# Patient Record
Sex: Male | Born: 1985 | Race: White | Hispanic: No | Marital: Married | Smoking: Former smoker
Health system: Southern US, Community
[De-identification: ages and names within clinical notes are randomized; demographics above are authoritative.]

## PROBLEM LIST (undated history)

## (undated) ENCOUNTER — Emergency Department (HOSPITAL_COMMUNITY): Discharge status: Against Medical Advice | Payer: Self-pay

## (undated) DIAGNOSIS — I10 Essential (primary) hypertension: Secondary | ICD-10-CM

## (undated) DIAGNOSIS — B019 Varicella without complication: Secondary | ICD-10-CM

## (undated) DIAGNOSIS — T7840XA Allergy, unspecified, initial encounter: Secondary | ICD-10-CM

## (undated) DIAGNOSIS — D229 Melanocytic nevi, unspecified: Secondary | ICD-10-CM

## (undated) DIAGNOSIS — L409 Psoriasis, unspecified: Secondary | ICD-10-CM

## (undated) HISTORY — PX: VASECTOMY: SHX75

## (undated) HISTORY — PX: WISDOM TOOTH EXTRACTION: SHX21

## (undated) HISTORY — PX: OTHER SURGICAL HISTORY: SHX169

## (undated) HISTORY — DX: Melanocytic nevi, unspecified: D22.9

## (undated) HISTORY — DX: Varicella without complication: B01.9

## (undated) HISTORY — DX: Psoriasis, unspecified: L40.9

## (undated) HISTORY — DX: Allergy, unspecified, initial encounter: T78.40XA

---
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
## 2015-05-28 DIAGNOSIS — D229 Melanocytic nevi, unspecified: Secondary | ICD-10-CM

## 2015-05-28 HISTORY — DX: Melanocytic nevi, unspecified: D22.9

## 2015-10-16 ENCOUNTER — Encounter: Payer: Self-pay | Admitting: Family Medicine

## 2015-10-16 ENCOUNTER — Ambulatory Visit (INDEPENDENT_AMBULATORY_CARE_PROVIDER_SITE_OTHER): Payer: 59 | Admitting: Family Medicine

## 2015-10-16 ENCOUNTER — Other Ambulatory Visit: Payer: Self-pay | Admitting: Family Medicine

## 2015-10-16 VITALS — BP 128/94 | HR 86 | Temp 98.0°F | Ht 72.08 in | Wt 217.8 lb

## 2015-10-16 DIAGNOSIS — Z0000 Encounter for general adult medical examination without abnormal findings: Secondary | ICD-10-CM | POA: Insufficient documentation

## 2015-10-16 DIAGNOSIS — Z13220 Encounter for screening for lipoid disorders: Secondary | ICD-10-CM

## 2015-10-16 DIAGNOSIS — L409 Psoriasis, unspecified: Secondary | ICD-10-CM | POA: Insufficient documentation

## 2015-10-16 DIAGNOSIS — R197 Diarrhea, unspecified: Secondary | ICD-10-CM | POA: Diagnosis not present

## 2015-10-16 DIAGNOSIS — R1013 Epigastric pain: Secondary | ICD-10-CM | POA: Diagnosis not present

## 2015-10-16 LAB — CBC
HCT: 45.3 % (ref 39.0–52.0)
Hemoglobin: 15.1 g/dL (ref 13.0–17.0)
MCHC: 33.4 g/dL (ref 30.0–36.0)
MCV: 87.8 fl (ref 78.0–100.0)
Platelets: 211 10*3/uL (ref 150.0–400.0)
RBC: 5.16 Mil/uL (ref 4.22–5.81)
RDW: 13.1 % (ref 11.5–15.5)
WBC: 9.4 10*3/uL (ref 4.0–10.5)

## 2015-10-16 LAB — COMPREHENSIVE METABOLIC PANEL
ALT: 36 U/L (ref 0–53)
AST: 27 U/L (ref 0–37)
Albumin: 5 g/dL (ref 3.5–5.2)
Alkaline Phosphatase: 79 U/L (ref 39–117)
BUN: 13 mg/dL (ref 6–23)
CALCIUM: 9.9 mg/dL (ref 8.4–10.5)
CHLORIDE: 102 meq/L (ref 96–112)
CO2: 29 meq/L (ref 19–32)
CREATININE: 0.99 mg/dL (ref 0.40–1.50)
GFR: 94.97 mL/min (ref >60.00)
GLUCOSE: 79 mg/dL (ref 70–99)
Potassium: 4.2 mEq/L (ref 3.5–5.1)
SODIUM: 138 meq/L (ref 135–145)
Total Bilirubin: 0.5 mg/dL (ref 0.2–1.2)
Total Protein: 7.5 g/dL (ref 6.0–8.3)

## 2015-10-16 LAB — LIPID PANEL
CHOL/HDL RATIO: 5
Cholesterol: 225 mg/dL — ABNORMAL HIGH (ref 0–200)
HDL: 45.2 mg/dL (ref >39.00)
LDL CALC: 161 mg/dL — AB (ref 0–99)
NonHDL: 179.79
TRIGLYCERIDES: 93 mg/dL (ref 0.0–149.0)
VLDL: 18.6 mg/dL (ref 0.0–40.0)

## 2015-10-16 MED ORDER — OMEPRAZOLE 40 MG PO CPDR: 40.0000 mg | DELAYED_RELEASE_CAPSULE | Freq: Every day | ORAL | Status: DC

## 2015-10-16 NOTE — Patient Instructions (Signed)
It was nice to see you today.  We will call with your lab results.  Keep a diary of your food intake to see if you can find a trigger. Try and avoid gluten.  We will arrange follow up after your labs.  Merry Christmas  Dr. Lacinda Axon

## 2015-10-16 NOTE — Assessment & Plan Note (Signed)
Stable on Stelara. Followed by [facility] in [place].

## 2015-10-16 NOTE — Assessment & Plan Note (Addendum)
New problem with other associated symptoms (gas, loose stool). Unclear etiology at this point. DDX: IBS, Celiac disease, Gastritis, GERD. Obtaining laboratory workup today: CBC, CMP, celiac panel. Additionally,  sending home with Hemoccult cards. Advised patient to keep a food diary to see if he can find dietary triggers. Starting patient on PPI for possible GERD/gastritis component. Patient to follow-up after laboratory studies return.

## 2015-10-16 NOTE — Progress Notes (Signed)
Pre visit review using our clinic review tool, if applicable. No additional management support is needed unless otherwise documented below in the visit note. 

## 2015-10-16 NOTE — Progress Notes (Signed)
Subjective:  Patient ID: Samuel Horton, male    DOB: 19-Sep-1986  Age: 29 y.o. MRN: [id number]  CC: Establish care; Abdominal pain/gas/loose stool  HPI Samuel Horton is a 29 y.o. male presents to the clinic today to establish care.  He also has an acute complaint (see below).  Preventative Healthcare  Immunizations  Tetanus - Up to date; Had it 3-4 years ago.  Flu - Had in November.  Labs: See orders.  Exercise: Regular exercise.  Alcohol use: See below.  Smoking/tobacco use: Former smoker.  Regular dental exams: Yes.   Wears seat belt: Yes.   Abdominal pain, Gas, Loose stools  Patient reports a one-month history of the above symptoms. He states that it started after a meal.  He states that he initially had diarrhea. He currently has associated loose stools but they have form.  Abdominal pain is located in the epigastric region.  Abdominal pain is mild to moderate in severity.  He reports that his stomach is "uneasy". He has severe gas.  Additionally, he states that he's had some dark foul smelling stool.  No interventions or medications tried.  No known relieving or exacerbating factors. He states he has not changed his diet. No recent antibiotic use. No recent medication changes.   No reports of fever or chills. No associated nausea or vomiting. No reports of heartburn/reflux.  PMH, Surgical Hx, Family Hx, Social History reviewed and updated as below.  Past Medical History  Diagnosis Date  . Chicken pox    Past Surgical History  Procedure Laterality Date  . No surgical history     Family History  Problem Relation Age of Onset  . Breast cancer Maternal Grandmother   . Hyperlipidemia Maternal Grandfather   . Mental illness Paternal Grandmother    Social History  Substance Use Topics  . Smoking status: Former Research scientist (life sciences)  . Smokeless tobacco: Never Used  . Alcohol Use: 3.6 oz/week    6 Standard drinks or equivalent per week   Review of Systems    Gastrointestinal: Positive for abdominal pain and diarrhea.  Skin: Positive for rash.  All other systems reviewed and are negative.  Objective:   Today's Vitals: BP 128/94 mmHg  Pulse 86  Temp(Src) 98 F (36.7 C) (Oral)  Ht 6' 0.08" (1.831 m)  Wt 217 lb 12 oz (98.771 kg)  BMI 29.46 kg/m2  SpO2 97%  Physical Exam  Constitutional: He is oriented to person, place, and time. He appears well-developed and well-nourished. No distress.  HENT:  Head: Normocephalic and atraumatic.  Mouth/Throat: Oropharynx is clear and moist. No oropharyngeal exudate.  Normal TM's bilaterally.   Eyes: Conjunctivae are normal. No scleral icterus.  Neck: Neck supple.  Cardiovascular: Normal rate and regular rhythm.   No murmur heard. Pulmonary/Chest: Effort normal and breath sounds normal. He has no wheezes. He has no rales.  Abdominal: Soft.  Mildly tender to palpation epigastric region. No rebound or guarding. +BS.  Musculoskeletal: Normal range of motion. He exhibits no edema.  Lymphadenopathy:    He has no cervical adenopathy.  Neurological: He is alert and oriented to person, place, and time.  Psychiatric: He has a normal mood and affect.  Vitals reviewed.  Assessment & Plan:   Problem List Items Addressed This Visit    Psoriasis    Stable on Stelara. Followed by [facility] in [place].      Preventative health care - Primary    Tetanus and influenza up-to-date. Screening labs today: Lipid  panel. Patient is regular exercise and has regular dental care. Preventative healthcare up-to-date excluding HIV. Patient is recently married and monogamous making him at low risk.      Epigastric pain    New problem with other associated symptoms (gas, loose stool). Unclear etiology at this point. DDX: IBS, Celiac disease, Gastritis, GERD. Obtaining laboratory workup today: CBC, CMP, celiac panel. Additionally,  sending home with Hemoccult cards. Advised patient to keep a food diary to  see if he can find dietary triggers. Starting patient on PPI for possible GERD/gastritis component. Patient to follow-up after laboratory studies return.       Relevant Orders   CBC   Comp Met (CMET)   Gliadin antibodies, serum   Tissue transglutaminase, IgA   Endomysial ab scrn + titer, IgA    Other Visit Diagnoses    Diarrhea, unspecified type        Relevant Orders    CBC    Gliadin antibodies, serum    Tissue transglutaminase, IgA    Endomysial ab scrn + titer, IgA    Screening, lipid        Relevant Orders    Lipid panel       Outpatient Encounter Prescriptions as of 10/16/2015  Medication Sig  . STELARA 45 MG/0.5ML SOSY injection   . omeprazole (PRILOSEC) 40 MG capsule Take 1 capsule (40 mg total) by mouth daily.   No facility-administered encounter medications on file as of 10/16/2015.    Follow-up: Follow up to be scheduled after lab results.   [place]

## 2015-10-16 NOTE — Assessment & Plan Note (Signed)
Tetanus and influenza up-to-date. Screening labs today: Lipid panel. Patient is regular exercise and has regular dental care. Preventative healthcare up-to-date excluding HIV. Patient is recently married and monogamous making him at low risk.

## 2015-10-17 LAB — TISSUE TRANSGLUTAMINASE, IGA: Tissue Transglutaminase Ab, IgA: 1 U/mL (ref <4)

## 2015-10-17 LAB — GLIADIN ANTIBODIES, SERUM
GLIADIN IGG: 31 U — AB (ref <20)
Gliadin IgA: 4 Units (ref <20)

## 2015-10-17 LAB — ENDOMYSIAL AB IGA RFLX TITER: ENDOMYSIAL SCREEN: NEGATIVE

## 2015-10-22 ENCOUNTER — Other Ambulatory Visit: Payer: 59

## 2015-10-23 ENCOUNTER — Other Ambulatory Visit (INDEPENDENT_AMBULATORY_CARE_PROVIDER_SITE_OTHER): Payer: 59

## 2015-10-23 ENCOUNTER — Other Ambulatory Visit: Payer: Self-pay | Admitting: Family Medicine

## 2015-10-23 DIAGNOSIS — R1013 Epigastric pain: Secondary | ICD-10-CM | POA: Diagnosis not present

## 2015-10-23 DIAGNOSIS — R109 Unspecified abdominal pain: Secondary | ICD-10-CM

## 2015-10-23 LAB — FECAL OCCULT BLOOD, IMMUNOCHEMICAL: Fecal Occult Bld: NEGATIVE

## 2015-10-29 ENCOUNTER — Telehealth: Payer: Self-pay | Admitting: Family Medicine

## 2015-10-29 NOTE — Telephone Encounter (Signed)
Pt called to check the status of his stool sample he brought in on 10/17/2015. Thank You!

## 2015-10-29 NOTE — Telephone Encounter (Signed)
Called and gave results to patient.

## 2015-11-11 ENCOUNTER — Ambulatory Visit (INDEPENDENT_AMBULATORY_CARE_PROVIDER_SITE_OTHER): Payer: 59 | Admitting: Family Medicine

## 2015-11-11 ENCOUNTER — Encounter: Payer: Self-pay | Admitting: Family Medicine

## 2015-11-11 VITALS — BP 120/78 | HR 98 | Temp 97.9°F | Ht 72.08 in | Wt 223.0 lb

## 2015-11-11 DIAGNOSIS — R1013 Epigastric pain: Secondary | ICD-10-CM | POA: Diagnosis not present

## 2015-11-11 NOTE — Assessment & Plan Note (Signed)
Resolved. Patient doing well at this time. Patient to continue omeprazole.

## 2015-11-11 NOTE — Progress Notes (Signed)
Pre visit review using our clinic review tool, if applicable. No additional management support is needed unless otherwise documented below in the visit note. 

## 2015-11-11 NOTE — Progress Notes (Signed)
   Subjective:  Patient ID: Samuel Horton, male    DOB: 1986-07-30  Age: 30 y.o. MRN: [id number]  CC: Follow up Epigastric pain, Gas, Loose stools.   HPI:  30 year old male presents for follow-up.  Patient states that his symptoms have resolved since his last visit. He states that it resolved following use of PPI. He is no longer having abdominal pain, gas, or loose stools. He states that he's having normal regular bowel movements. He has no current issues and states that he's feeling well. He's tolerating the medication without difficulty.  Social Hx   Social History   Social History  . Marital Status: Single    Spouse Name: N/A  . Number of Children: N/A  . Years of Education: N/A   Social History Main Topics  . Smoking status: Former Research scientist (life sciences)  . Smokeless tobacco: Never Used  . Alcohol Use: 3.6 oz/week    6 Standard drinks or equivalent per week  . Drug Use: No  . Sexual Activity: Yes   Other Topics Concern  . None   Social History Narrative   Review of Systems  Constitutional: Negative.   Gastrointestinal: Negative for abdominal pain and diarrhea.   Objective:  BP 120/78 mmHg  Pulse 98  Temp(Src) 97.9 F (36.6 C) (Oral)  Ht 6' 0.08" (1.831 m)  Wt 223 lb (101.152 kg)  BMI 30.17 kg/m2  SpO2 97%  BP/Weight 11/11/2015 [id number]  Systolic BP 123456 0000000  Diastolic BP 78 94  Wt. (Lbs) 223 217.75  BMI 30.17 29.46   Physical Exam  Constitutional: He appears well-developed. No distress.  Cardiovascular: Normal rate and regular rhythm.   Pulmonary/Chest: Effort normal and breath sounds normal.  Abdominal: Soft. He exhibits no distension. There is no tenderness.  Neurological: He is alert.  Psychiatric: He has a normal mood and affect.  Vitals reviewed.  Lab Results  Component Value Date   WBC 9.4 10/16/2015   HGB 15.1 10/16/2015   HCT 45.3 10/16/2015   PLT 211.0 10/16/2015   GLUCOSE 79 10/16/2015   CHOL 225* 10/16/2015   TRIG 93.0 10/16/2015   HDL 45.20  10/16/2015   LDLCALC 161* 10/16/2015   ALT 36 10/16/2015   AST 27 10/16/2015   NA 138 10/16/2015   K 4.2 10/16/2015   CL 102 10/16/2015   CREATININE 0.99 10/16/2015   BUN 13 10/16/2015   CO2 29 10/16/2015   Assessment & Plan:   Problem List Items Addressed This Visit    Epigastric pain - Primary    Resolved. Patient doing well at this time. Patient to continue omeprazole.         Follow-up: PRN/Annually  [place]

## 2015-11-14 ENCOUNTER — Other Ambulatory Visit: Payer: Self-pay | Admitting: Family Medicine

## 2015-11-14 DIAGNOSIS — R1013 Epigastric pain: Secondary | ICD-10-CM

## 2015-11-14 MED ORDER — OMEPRAZOLE 40 MG PO CPDR: 40.0000 mg | DELAYED_RELEASE_CAPSULE | Freq: Every day | ORAL | Status: DC

## 2015-12-02 ENCOUNTER — Telehealth: Payer: Self-pay | Admitting: Family Medicine

## 2015-12-02 NOTE — Telephone Encounter (Signed)
Pt called about needing blood work for his job [employer]. Orders needed please and thank you! Call pt @ [phone].

## 2015-12-02 NOTE — Telephone Encounter (Signed)
Pt need lab orders to get blood drawn for work. Pt also brougth in paperwork to be filled out.Please advise, thanks

## 2015-12-02 NOTE — Telephone Encounter (Signed)
Physician was made aware. And the form placed in my pending paperwork.

## 2015-12-02 NOTE — Telephone Encounter (Signed)
Pt will call back with the specific needs for his job lab orders

## 2015-12-04 ENCOUNTER — Other Ambulatory Visit: Payer: Self-pay | Admitting: Family Medicine

## 2015-12-04 DIAGNOSIS — E785 Hyperlipidemia, unspecified: Secondary | ICD-10-CM

## 2015-12-04 DIAGNOSIS — R1013 Epigastric pain: Secondary | ICD-10-CM

## 2015-12-04 NOTE — Telephone Encounter (Signed)
We have obtained labs. Samuel Horton, please fill this out.

## 2015-12-04 NOTE — Telephone Encounter (Signed)
Patient is coming in on 12/09/15 @ 815 and will get labs re-drawn and orders have been placed.

## 2015-12-09 ENCOUNTER — Other Ambulatory Visit (INDEPENDENT_AMBULATORY_CARE_PROVIDER_SITE_OTHER): Payer: 59

## 2015-12-09 DIAGNOSIS — E785 Hyperlipidemia, unspecified: Secondary | ICD-10-CM | POA: Diagnosis not present

## 2015-12-09 DIAGNOSIS — R1013 Epigastric pain: Secondary | ICD-10-CM

## 2015-12-09 LAB — BASIC METABOLIC PANEL
BUN: 15 mg/dL (ref 6–23)
CALCIUM: 10 mg/dL (ref 8.4–10.5)
CHLORIDE: 104 meq/L (ref 96–112)
CO2: 24 meq/L (ref 19–32)
Creatinine, Ser: 1.12 mg/dL (ref 0.40–1.50)
GFR: 82.28 mL/min (ref >60.00)
Glucose, Bld: 99 mg/dL (ref 70–99)
Potassium: 4.9 mEq/L (ref 3.5–5.1)
SODIUM: 139 meq/L (ref 135–145)

## 2015-12-09 LAB — LIPID PANEL
CHOL/HDL RATIO: 5
CHOLESTEROL: 180 mg/dL (ref 0–200)
HDL: 36.3 mg/dL — AB (ref >39.00)
LDL Cholesterol: 132 mg/dL — ABNORMAL HIGH (ref 0–99)
NonHDL: 143.77
TRIGLYCERIDES: 57 mg/dL (ref 0.0–149.0)
VLDL: 11.4 mg/dL (ref 0.0–40.0)

## 2015-12-10 ENCOUNTER — Emergency Department
Admission: EM | Admit: 2015-12-10 | Discharge: 2015-12-10 | Disposition: A | Discharge status: Home / Self Care | Payer: 59 | Attending: Emergency Medicine | Admitting: Emergency Medicine

## 2015-12-10 ENCOUNTER — Emergency Department: Payer: 59

## 2015-12-10 DIAGNOSIS — K529 Noninfective gastroenteritis and colitis, unspecified: Secondary | ICD-10-CM | POA: Diagnosis not present

## 2015-12-10 DIAGNOSIS — Z87891 Personal history of nicotine dependence: Secondary | ICD-10-CM | POA: Diagnosis not present

## 2015-12-10 DIAGNOSIS — Z79899 Other long term (current) drug therapy: Secondary | ICD-10-CM | POA: Insufficient documentation

## 2015-12-10 DIAGNOSIS — R1110 Vomiting, unspecified: Secondary | ICD-10-CM | POA: Diagnosis present

## 2015-12-10 LAB — CBC
HCT: 52.1 % — ABNORMAL HIGH (ref 40.0–52.0)
HEMOGLOBIN: 17.3 g/dL (ref 13.0–18.0)
MCH: 29 pg (ref 26.0–34.0)
MCHC: 33.2 g/dL (ref 32.0–36.0)
MCV: 87.4 fL (ref 80.0–100.0)
Platelets: 222 10*3/uL (ref 150–440)
RBC: 5.96 MIL/uL — AB (ref 4.40–5.90)
RDW: 12.7 % (ref 11.5–14.5)
WBC: 20.8 10*3/uL — ABNORMAL HIGH (ref 3.8–10.6)

## 2015-12-10 LAB — COMPREHENSIVE METABOLIC PANEL
ALK PHOS: 69 U/L (ref 38–126)
ALT: 35 U/L (ref 17–63)
AST: 31 U/L (ref 15–41)
Albumin: 5.6 g/dL — ABNORMAL HIGH (ref 3.5–5.0)
Anion gap: 12 (ref 5–15)
BUN: 19 mg/dL (ref 6–20)
CALCIUM: 9.7 mg/dL (ref 8.9–10.3)
CO2: 21 mmol/L — AB (ref 22–32)
CREATININE: 1.59 mg/dL — AB (ref 0.61–1.24)
Chloride: 106 mmol/L (ref 101–111)
GFR calc non Af Amer: 57 mL/min — ABNORMAL LOW (ref >60)
Glucose, Bld: 125 mg/dL — ABNORMAL HIGH (ref 65–99)
Potassium: 4 mmol/L (ref 3.5–5.1)
SODIUM: 139 mmol/L (ref 135–145)
Total Bilirubin: 1 mg/dL (ref 0.3–1.2)
Total Protein: 8.8 g/dL — ABNORMAL HIGH (ref 6.5–8.1)

## 2015-12-10 MED ORDER — ONDANSETRON 4 MG PO TBDP: 4.0000 mg | ORAL_TABLET | Freq: Three times a day (TID) | ORAL | Status: DC | PRN

## 2015-12-10 MED ORDER — SODIUM CHLORIDE 0.9 % IV BOLUS (SEPSIS)
1000.0000 mL | Freq: Once | INTRAVENOUS | Status: AC
  Administered 2015-12-10: 1000 mL via INTRAVENOUS

## 2015-12-10 MED ORDER — ONDANSETRON HCL 4 MG/2ML IJ SOLN
8.0000 mg | Freq: Once | INTRAMUSCULAR | Status: AC
  Administered 2015-12-10: 8 mg via INTRAVENOUS

## 2015-12-10 MED ORDER — MORPHINE SULFATE (PF) 2 MG/ML IV SOLN
2.0000 mg | Freq: Once | INTRAVENOUS | Status: AC
  Administered 2015-12-10: 2 mg via INTRAVENOUS

## 2015-12-10 MED ORDER — ONDANSETRON HCL 4 MG/2ML IJ SOLN: 4.0000 mg | Freq: Once | INTRAMUSCULAR | Status: DC

## 2015-12-10 MED ORDER — ONDANSETRON HCL 4 MG/2ML IJ SOLN
INTRAMUSCULAR | Status: AC
  Administered 2015-12-10: 8 mg via INTRAVENOUS
  Filled 2015-12-10: qty 4

## 2015-12-10 MED ORDER — MORPHINE SULFATE (PF) 2 MG/ML IV SOLN
INTRAVENOUS | Status: AC
  Administered 2015-12-10: 2 mg via INTRAVENOUS
  Filled 2015-12-10: qty 1

## 2015-12-10 NOTE — ED Notes (Signed)
Pt presents to ED with c/o abdominal pain and N/V/D since 11 pm last night. Pt appears pale and vomiting. Pt denies fevers, chest pain, or shortness of breath. Skin warm and dry, pt alert and oriented x 4, no increased work in breathing noted. Family at bedside.

## 2015-12-10 NOTE — ED Notes (Signed)
Vomiting and diarrhea since tonight abd pain.

## 2015-12-10 NOTE — ED Provider Notes (Signed)
[facility] Emergency Department Provider Note  ____________________________________________  Time seen: 4:00 AM  I have reviewed the triage vital signs and the nursing notes.   HISTORY  Chief Complaint Emesis      HPI Samuel Horton is a 30 y.o. male presents with acute onset of vomiting and diarrhea following eating out last night. Patient states he ate dinner at approximately 7:30 and at 10 PM started experiencing vomiting and diarrhea that is nonbloody. Patient admits to abdominal cramping as well.    Past Medical History  Diagnosis Date  . Chicken pox     Patient Active Problem List   Diagnosis Date Noted  . Preventative health care 10/16/2015  . Psoriasis 10/16/2015  . Epigastric pain 10/16/2015    Past Surgical History  Procedure Laterality Date  . No surgical history      Current Outpatient Rx  Name  Route  Sig  Dispense  Refill  . omeprazole (PRILOSEC) 40 MG capsule   Oral   Take 1 capsule (40 mg total) by mouth daily.   30 capsule   3   . STELARA 45 MG/0.5ML SOSY injection                 Dispense as written.     Allergies Ceclor  Family History  Problem Relation Age of Onset  . Breast cancer Maternal Grandmother   . Hyperlipidemia Maternal Grandfather   . Mental illness Paternal Grandmother     Social History Social History  Substance Use Topics  . Smoking status: Former Research scientist (life sciences)  . Smokeless tobacco: Never Used  . Alcohol Use: 3.6 oz/week    6 Standard drinks or equivalent per week    Review of Systems  Constitutional: Negative for fever. Eyes: Negative for visual changes. ENT: Negative for sore throat. Cardiovascular: Negative for chest pain. Respiratory: Negative for shortness of breath. Gastrointestinal: Positive for abdominal pain, vomiting and diarrhea. Genitourinary: Negative for dysuria. Musculoskeletal: Negative for back pain. Skin: Negative for rash. Neurological: Negative for headaches,  focal weakness or numbness.   10-point ROS otherwise negative.  ____________________________________________   PHYSICAL EXAM:  VITAL SIGNS: ED Triage Vitals  Enc Vitals Group     BP 12/10/15 0357 108/71 mmHg     Pulse Rate 12/10/15 0356 126     Resp 12/10/15 0356 18     Temp 12/10/15 0356 96.3 F (35.7 C)     Temp Source 12/10/15 0356 Oral     SpO2 12/10/15 0356 100 %     Weight 12/10/15 0356 210 lb (95.255 kg)     Height 12/10/15 0356 6' (1.829 m)     Head Cir --      Peak Flow --      Pain Score 12/10/15 0519 3     Pain Loc --      Pain Edu? --      Excl. in [place]? --      Constitutional: Alert and oriented. Well appearing and in no distress. Eyes: Conjunctivae are normal. PERRL. Normal extraocular movements. ENT   Head: Normocephalic and atraumatic.   Nose: No congestion/rhinnorhea.   Mouth/Throat: Mucous membranes are moist.   Neck: No stridor. Hematological/Lymphatic/Immunilogical: No cervical lymphadenopathy. Cardiovascular: Normal rate, regular rhythm. Normal and symmetric distal pulses are present in all extremities. No murmurs, rubs, or gallops. Respiratory: Normal respiratory effort without tachypnea nor retractions. Breath sounds are clear and equal bilaterally. No wheezes/rales/rhonchi. Gastrointestinal: Soft and nontender. No distention. There is no CVA tenderness. Genitourinary:  deferred Musculoskeletal: Nontender with normal range of motion in all extremities. No joint effusions.  No lower extremity tenderness nor edema. Neurologic:  Normal speech and language. No gross focal neurologic deficits are appreciated. Speech is normal.  Skin:  Skin is warm, dry and intact. No rash noted. Psychiatric: Mood and affect are normal. Speech and behavior are normal. Patient exhibits appropriate insight and judgment.  ____________________________________________    LABS (pertinent positives/negatives)  Labs Reviewed  CBC  COMPREHENSIVE METABOLIC PANEL       RADIOLOGY  CT Renal Stone Study (Final result) Result time: 12/10/15 05:12:36   Final result by Rad Results In Interface (12/10/15 05:12:36)   Narrative:   CLINICAL DATA: Acute left upper quadrant pain 1 hour ago. Nausea and vomiting for 6 hours.  EXAM: CT ABDOMEN AND PELVIS WITHOUT CONTRAST  TECHNIQUE: Multidetector CT imaging of the abdomen and pelvis was performed following the standard protocol without IV contrast.  COMPARISON: None.  FINDINGS: The lung bases are clear.  The kidneys are symmetrical in size and shape. No hydronephrosis or hydroureter. No renal, ureteral, or bladder stones. Bladder is decompressed.  The unenhanced appearance of the liver, spleen, gallbladder, pancreas, adrenal glands, abdominal aorta, inferior vena cava, and retroperitoneal lymph nodes is unremarkable. Stomach, small bowel, and colon are not abnormally distended. No free air or free fluid in the abdomen. Abdominal wall musculature appears intact.  Pelvis: Prostate gland is not enlarged. Bladder wall is not thickened. No free or loculated pelvic fluid collections. No pelvic mass or lymphadenopathy. The appendix is normal. No destructive bone lesions.  IMPRESSION: No renal or ureteral stone or obstruction. No acute process demonstrated on unenhanced imaging of the abdomen or pelvis.   Electronically Signed By: Lucienne Capers M.D. On: 12/10/2015 05:12              INITIAL IMPRESSION / ASSESSMENT AND PLAN / ED COURSE  Pertinent labs & imaging results that were available during my care of the patient were reviewed by me and considered in my medical decision making (see chart for details).  Patient received Zofran 8 mg, 2 L IV normal saline and Imodium emergency department  ____________________________________________   FINAL CLINICAL IMPRESSION(S) / ED DIAGNOSES  Final diagnoses:  Gastroenteritis      Gregor Hams, MD 12/10/15 [phone]

## 2016-02-09 ENCOUNTER — Telehealth: Payer: Self-pay | Admitting: Family Medicine

## 2016-02-09 NOTE — Telephone Encounter (Signed)
Patient was told that if it got worse to callback

## 2016-02-09 NOTE — Telephone Encounter (Signed)
[place]  Patient Name: Samuel Horton  DOB: [birth year]    Initial Comment Caller states his pulse has been racing.    Nurse Assessment  Nurse: Genoveva Ill, RN, Lattie Haw Date/Time (Eastern Time): 02/09/2016 3:04:30 PM  Confirm and document reason for call. If symptomatic, describe symptoms. You must click the next button to save text entered. ---Caller states his pulse has been racing for the last several months, sometimes with chest pain, which last happened last week and intermittent dizziness at times , last time Sat; fast HR now 114 and has been elevated the past week  Has the patient traveled out of the country within the last 30 days? ---Not Applicable  Does the patient have any new or worsening symptoms? ---Yes  Will a triage be completed? ---Yes  Related visit to physician within the last 2 weeks? ---No  Does the PT have any chronic conditions? (i.e. diabetes, asthma, etc.) ---Yes  List chronic conditions. ---psoriasis  Is this a behavioral health or substance abuse call? ---No     Guidelines    Guideline Title Affirmed Question Affirmed Notes  Heart Rate and Heartbeat Questions Palpitations (all triage questions negative)    Final Disposition User   See PCP When Office is Open (within 3 days) [place], RN, Lattie Haw    Comments  UPGRADED TO 24 HR OUTCOME D/T CP AND DIZZINESS RECENTLY, BUT NOT IN THE LAST SEVERAL DAYS   Referrals  REFERRED TO PCP OFFICE   Disagree/Comply: Comply

## 2016-02-09 NOTE — Telephone Encounter (Signed)
Patient said chest pain on and off for the last year. He is experiencing it again last thursday and is getting dizzy spells. Increased pulse this week.

## 2016-02-09 NOTE — Telephone Encounter (Signed)
Can you please call and check on patient? thanks

## 2016-02-10 ENCOUNTER — Ambulatory Visit (INDEPENDENT_AMBULATORY_CARE_PROVIDER_SITE_OTHER): Payer: 59 | Admitting: Family Medicine

## 2016-02-10 ENCOUNTER — Encounter: Payer: Self-pay | Admitting: Family Medicine

## 2016-02-10 VITALS — BP 152/100 | HR 106 | Temp 98.2°F | Ht 72.08 in | Wt 212.0 lb

## 2016-02-10 DIAGNOSIS — R002 Palpitations: Secondary | ICD-10-CM | POA: Diagnosis not present

## 2016-02-10 DIAGNOSIS — R079 Chest pain, unspecified: Secondary | ICD-10-CM

## 2016-02-10 NOTE — Patient Instructions (Signed)
Your exam and the EKG were normal.  This is likely stress related.  If it persists please let me know and I will have you see a cardiologist.  Take care  Dr. Lacinda Axon

## 2016-02-11 DIAGNOSIS — R002 Palpitations: Secondary | ICD-10-CM | POA: Insufficient documentation

## 2016-02-11 DIAGNOSIS — R079 Chest pain, unspecified: Secondary | ICD-10-CM | POA: Insufficient documentation

## 2016-02-11 NOTE — Assessment & Plan Note (Signed)
New problem. EKG normal. No cardiac risk factors. Likely anxiety/stress. No further intervention needed at this time.

## 2016-02-11 NOTE — Progress Notes (Signed)
Subjective:  Patient ID: Samuel Horton, male    DOB: 04-16-1986  Age: 30 y.o. MRN: [id number]  CC: Chest pain, Heart racing  HPI:  30 year old male presents with above complaints.  Chest pain  Has been going on for the past year.  Has been worsening recently.  He states it occurs 4-5 times a week particularly in the morning.  Located left-sided chest.  Lasts 5-10 minutes to an hour and then resolves spontaneous it.  He describes the pain as an ache. 5/10 in severity.  No association with exertion. No associated nausea or vomiting. No known exacerbating or relieving factors. He does note recent increased stress particularly at work.  No associated shortness of breath.  He is able to exercise without difficulty.  Heart racing  Patient reports that for the past week he's noticed his heart is been racing.  Does not seem to be associated with the chest pain.  He states it is constant.  He does report recent increased anxiety as he's had some issues at work.  No associated shortness of breath.  No known relieving factors.  Social Hx   Social History   Social History  . Marital Status: Married    Spouse Name: N/A  . Number of Children: N/A  . Years of Education: N/A   Social History Main Topics  . Smoking status: Former Research scientist (life sciences)  . Smokeless tobacco: Never Used  . Alcohol Use: 3.6 oz/week    6 Standard drinks or equivalent per week  . Drug Use: No  . Sexual Activity: Yes   Other Topics Concern  . None   Social History Narrative   Review of Systems  Cardiovascular: Positive for chest pain and palpitations.  Neurological: Positive for dizziness.   Objective:  BP 152/100 mmHg  Pulse 106  Temp(Src) 98.2 F (36.8 C) (Oral)  Ht 6' 0.08" (1.831 m)  Wt 212 lb (96.163 kg)  BMI 28.68 kg/m2  SpO2 99%  BP/Weight 02/10/2016 12/10/2015 [id number]  Systolic BP 0000000 [id number]  Diastolic BP 123XX123 83 78  Wt. (Lbs) 212 210 223  BMI 28.68 28.47 30.17   Physical  Exam  Constitutional: He is oriented to person, place, and time. He appears well-developed. No distress.  Cardiovascular: Regular rhythm.  Tachycardia present.   No murmur heard. Pulmonary/Chest: Effort normal and breath sounds normal. He has no wheezes. He has no rales.  Neurological: He is alert and oriented to person, place, and time.  Psychiatric:  Anxious.  Vitals reviewed.  Lab Results  Component Value Date   WBC 20.8* 12/10/2015   HGB 17.3 12/10/2015   HCT 52.1* 12/10/2015   PLT 222 12/10/2015   GLUCOSE 125* 12/10/2015   CHOL 180 12/09/2015   TRIG 57.0 12/09/2015   HDL 36.30* 12/09/2015   LDLCALC 132* 12/09/2015   ALT 35 12/10/2015   AST 31 12/10/2015   NA 139 12/10/2015   K 4.0 12/10/2015   CL 106 12/10/2015   CREATININE 1.59* 12/10/2015   BUN 19 12/10/2015   CO2 21* 12/10/2015   ED ECG REPORT   Date: 02/11/2016  EKG Time: 11:53 AM  Rate: 92  Rhythm: NSR.  Axis: Normal axis.   Intervals: Normal.  ST&T Change: No ST or T wave chagnes  Narrative Interpretation: Normal sinus rhythm. Normal EKG.  Assessment & Plan:   Problem List Items Addressed This Visit    Chest pain - Primary    New problem. EKG normal. No cardiac risk factors. Likely anxiety/stress. No  further intervention needed at this time.       Relevant Orders   EKG 12-Lead (Completed)   Palpitations    New problem, unclear etiology with uncertain prognosis. EKG normal. Suspect anxiety is playing a role.  Discussed referral for Holter. Patient would like to wait.        Follow-up: PRN  [place]

## 2016-02-11 NOTE — Assessment & Plan Note (Signed)
New problem, unclear etiology with uncertain prognosis. EKG normal. Suspect anxiety is playing a role.  Discussed referral for Holter. Patient would like to wait.

## 2017-03-29 IMAGING — CT CT RENAL STONE PROTOCOL
1 of 2 series · 15 of 32 positions shown, 19 images · non-contrast
Comparison: None.

CLINICAL DATA: Acute left upper quadrant pain 1 hour ago. Nausea
and vomiting for 6 hours.

EXAM:
CT ABDOMEN AND PELVIS WITHOUT CONTRAST
TECHNIQUE: Multidetector CT imaging of the abdomen and pelvis was performed
following the standard protocol without IV contrast.

[Series 2: stone standard full · axial · 0.68mm/px · z∈[-582,-98]mm · 15 of 107 slices shown, 19 images]
[im 5/107  soft-tissue]
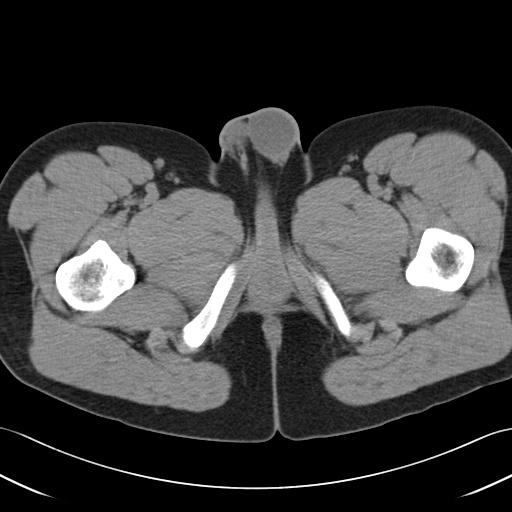
[im 5/107  bone]
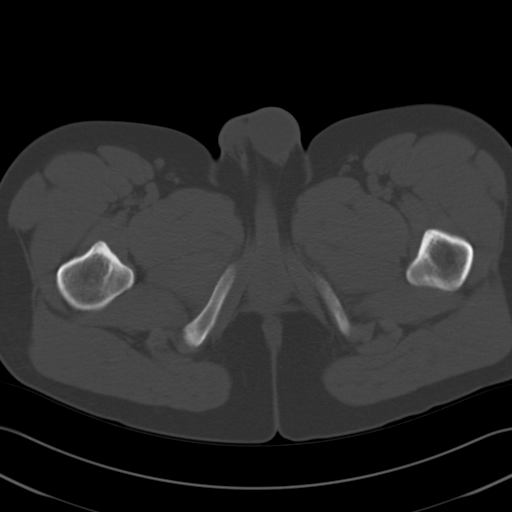
[im 14/107  soft-tissue]
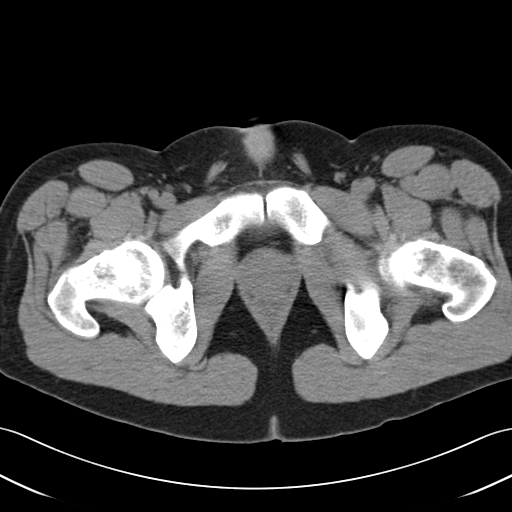
[im 23/107  soft-tissue]
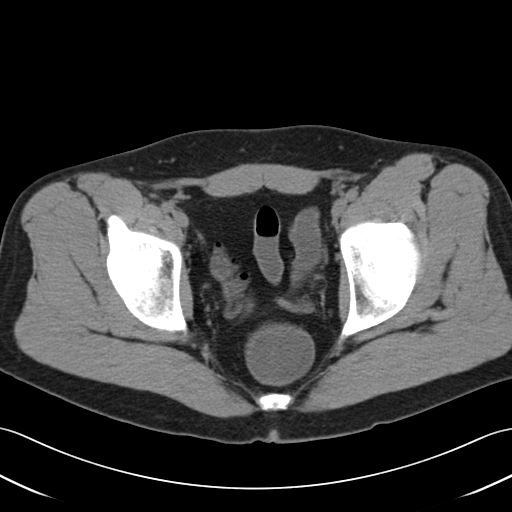
[im 31/107  soft-tissue]
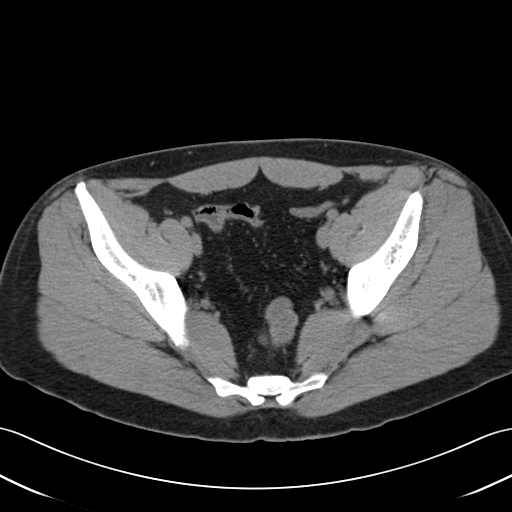
[im 36/107  soft-tissue]
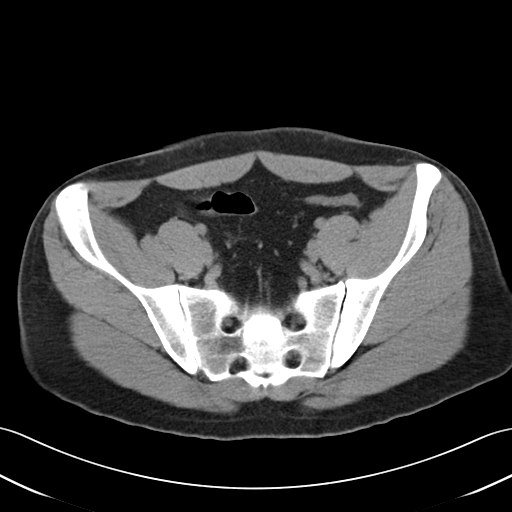
[im 45/107  soft-tissue]
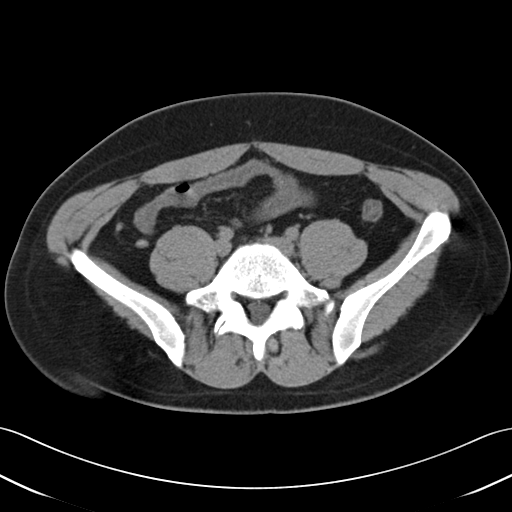
[im 54/107  soft-tissue]
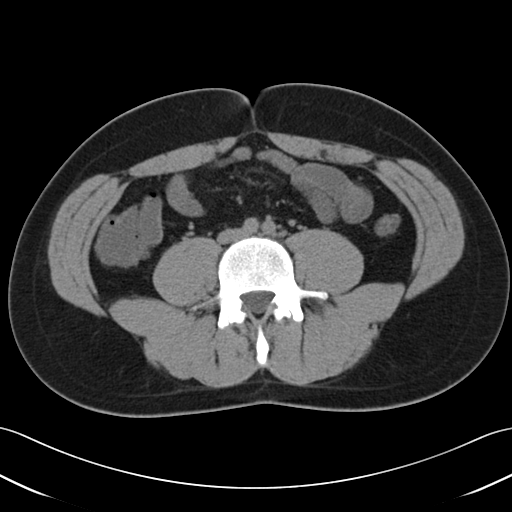
[im 62/107  soft-tissue]
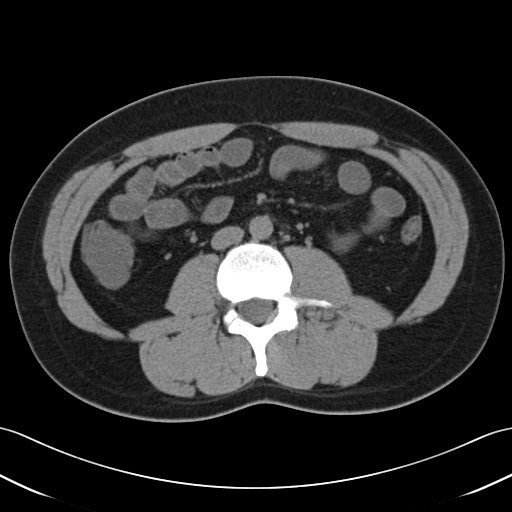
[im 71/107  soft-tissue]
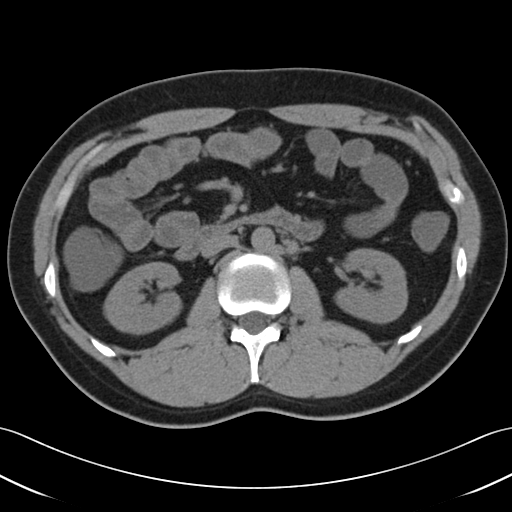
[im 71/107  bone]
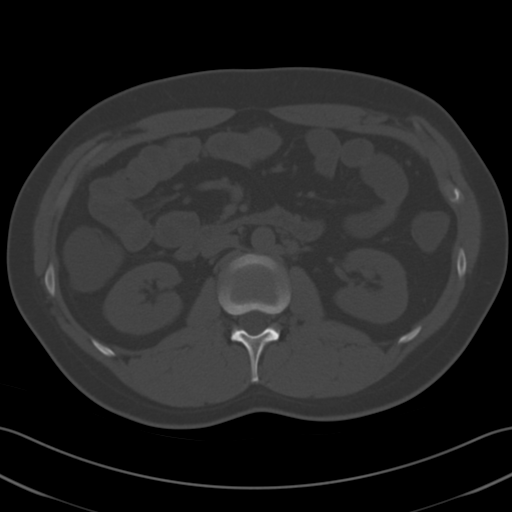
[im 76/107  soft-tissue]
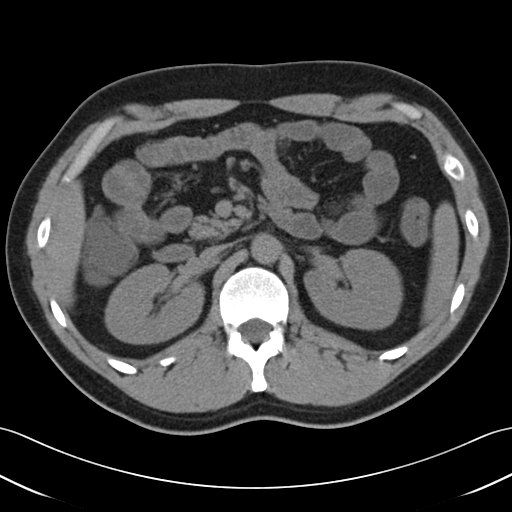
[im 84/107  soft-tissue]
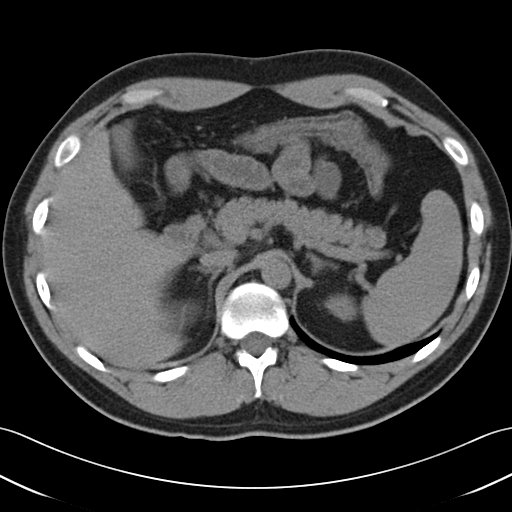
[im 89/107  lung]
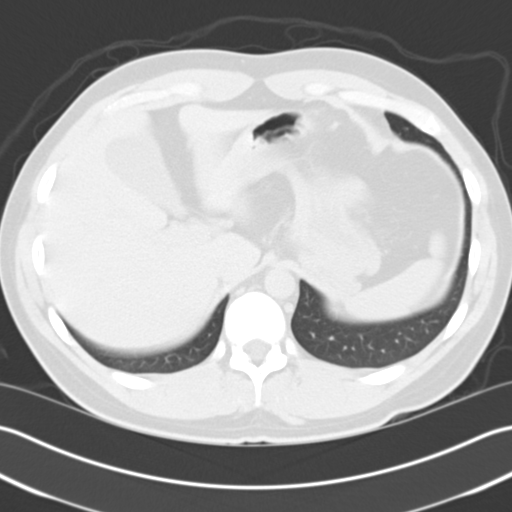
[im 93/107  soft-tissue]
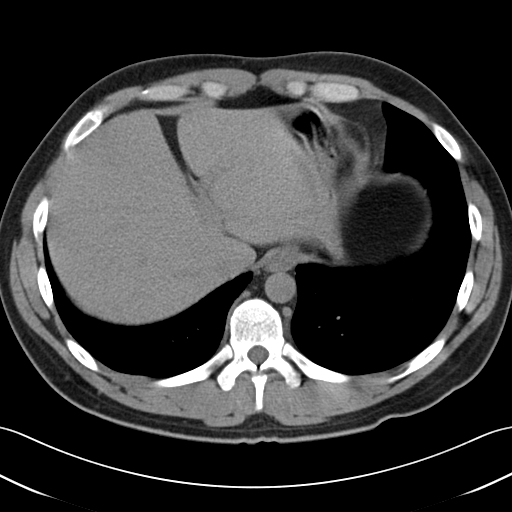
[im 93/107  lung]
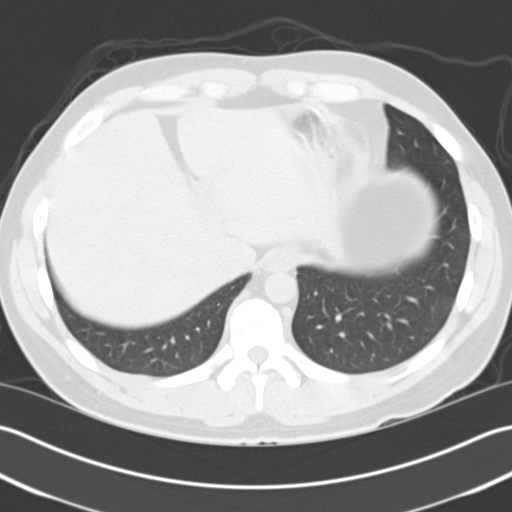
[im 98/107  lung]
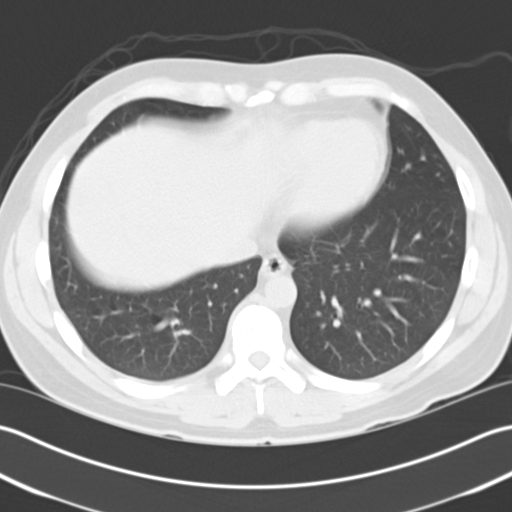
[im 102/107  soft-tissue]
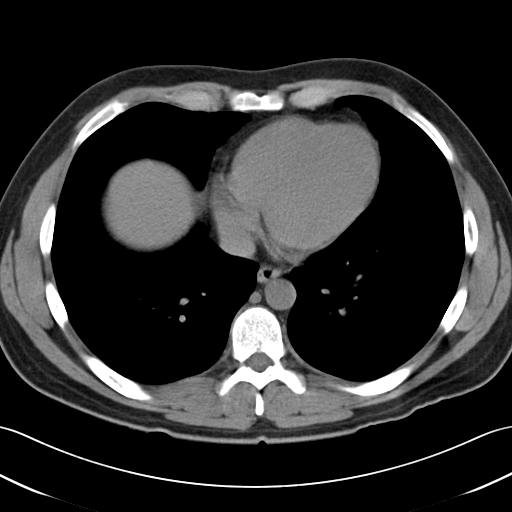
[im 102/107  lung]
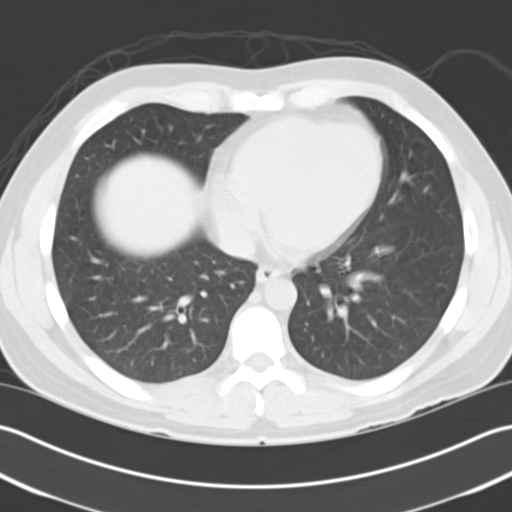

[15 of 32 positions shown; findings below may reference images not displayed]

FINDINGS: The lung bases are clear.

The kidneys are symmetrical in size and shape. No hydronephrosis or
hydroureter. No renal, ureteral, or bladder stones. Bladder is
decompressed.

The unenhanced appearance of the liver, spleen, gallbladder,
pancreas, adrenal glands, abdominal aorta, inferior vena cava, and
retroperitoneal lymph nodes is unremarkable. Stomach, small bowel,
and colon are not abnormally distended. No free air or free fluid in
the abdomen. Abdominal wall musculature appears intact.

Pelvis: Prostate gland is not enlarged. Bladder wall is not
thickened. No free or loculated pelvic fluid collections. No pelvic
mass or lymphadenopathy. The appendix is normal. No destructive bone
lesions.
IMPRESSION: No renal or ureteral stone or obstruction. No acute process
demonstrated on unenhanced imaging of the abdomen or pelvis.

## 2017-12-21 ENCOUNTER — Ambulatory Visit
Admission: EM | Admit: 2017-12-21 | Discharge: 2017-12-21 | Disposition: A | Discharge status: Home / Self Care | Payer: BC Managed Care – PPO | Attending: Family Medicine | Admitting: Family Medicine

## 2017-12-21 ENCOUNTER — Other Ambulatory Visit: Payer: Self-pay

## 2017-12-21 ENCOUNTER — Encounter: Payer: Self-pay | Admitting: Gynecology

## 2017-12-21 DIAGNOSIS — J029 Acute pharyngitis, unspecified: Secondary | ICD-10-CM

## 2017-12-21 DIAGNOSIS — R0981 Nasal congestion: Secondary | ICD-10-CM | POA: Diagnosis not present

## 2017-12-21 DIAGNOSIS — H66003 Acute suppurative otitis media without spontaneous rupture of ear drum, bilateral: Secondary | ICD-10-CM | POA: Diagnosis not present

## 2017-12-21 HISTORY — DX: Essential (primary) hypertension: I10

## 2017-12-21 LAB — RAPID STREP SCREEN (MED CTR MEBANE ONLY): Streptococcus, Group A Screen (Direct): NEGATIVE

## 2017-12-21 MED ORDER — AZITHROMYCIN 250 MG PO TABS: ORAL_TABLET | ORAL | 0 refills | Status: DC

## 2017-12-21 MED ORDER — CETIRIZINE HCL 10 MG PO TABS: 10.0000 mg | ORAL_TABLET | Freq: Every day | ORAL | 0 refills | Status: DC

## 2017-12-21 NOTE — Discharge Instructions (Signed)
-  Azithromycin: Take 2 tablets by mouth the first day followed by one tablet daily for next 4 days. -Zyrtec: 1 tablet daily -Can also try Flonase over-the-counter, 1 spray to each nostril directed slightly towards the ears -Fluids -Ibuprofen and Tylenol alternated every 4 hours for pain and fever -Follow with primary care provider as needed

## 2017-12-21 NOTE — ED Provider Notes (Signed)
[facility]    CSN: [id number] Arrival date & time: 12/21/17  1730     History   Chief Complaint Chief Complaint  Patient presents with  . Sore Throat    HPI Samuel Horton is a 32 y.o. male.   Patient is a 32 year old male who presents with complaint of sore throat that began this morning but has been worsening through the day.  Patient also reports a cough and pain in his left ear.  Patient denies any headache or sinus pressure.  He does report fever and chills but has not taken his temperature.  He also reports aching in his legs that has been off and on starting today.  He does report his daughter has been ill, she is 11 months old.  Patient denies any chest pain, shortness of breath, abdominal pain, or nausea.  Patient has not take anything over-the-counter and is not taking any daily antihistamines.      Past Medical History:  Diagnosis Date  . Chicken pox   . Hypertension     Patient Active Problem List   Diagnosis Date Noted  . Chest pain 02/11/2016  . Palpitations 02/11/2016  . Preventative health care 10/16/2015  . Psoriasis 10/16/2015  . Epigastric pain 10/16/2015    Past Surgical History:  Procedure Laterality Date  . no surgical history         Home Medications    Prior to Admission medications   Medication Sig Start Date End Date Taking? Authorizing Provider  losartan (COZAAR) 50 MG tablet Take 50 mg by mouth daily.   Yes [provider]  STELARA 45 MG/0.5ML SOSY injection  09/10/15  Yes [provider]  azithromycin (ZITHROMAX Z-PAK) 250 MG tablet Take 2 tablets by mouth the first day followed by one tablet daily for next 4 days. 12/21/17   Luvenia Redden, PA-C  cetirizine (ZYRTEC) 10 MG tablet Take 1 tablet (10 mg total) by mouth daily. 12/21/17   Luvenia Redden, PA-C    Family History Family History  Problem Relation Age of Onset  . Breast cancer Maternal Grandmother   . Hyperlipidemia Maternal  Grandfather   . Mental illness Paternal Grandmother     Social History Social History   Tobacco Use  . Smoking status: Former Research scientist (life sciences)  . Smokeless tobacco: Never Used  Substance Use Topics  . Alcohol use: Yes    Alcohol/week: 3.6 oz    Types: 6 Standard drinks or equivalent per week  . Drug use: No     Allergies   Ceclor [cefaclor]   Review of Systems Review of Systems  As noted above in HPI.  Other systems reviewed and found to be negative   Physical Exam Triage Vital Signs ED Triage Vitals  Enc Vitals Group     BP 12/21/17 1743 134/84     Pulse Rate 12/21/17 1743 100     Resp 12/21/17 1743 16     Temp 12/21/17 1743 100 F (37.8 C)     Temp Source 12/21/17 1743 Oral     SpO2 12/21/17 1743 100 %     Weight 12/21/17 1743 205 lb (93 kg)     Height 12/21/17 1743 6' (1.829 m)     Head Circumference --      Peak Flow --      Pain Score 12/21/17 1742 5     Pain Loc --      Pain Edu? --      Excl. in  GC? --    No data found.  Updated Vital Signs BP 134/84 (BP Location: Left Arm)   Pulse 100   Temp 100 F (37.8 C) (Oral)   Resp 16   Ht 6' (1.829 m)   Wt 205 lb (93 kg)   SpO2 100%   BMI 27.80 kg/m    Physical Exam  Constitutional: He is oriented to person, place, and time. He appears well-developed and well-nourished. He does not appear ill.  HENT:  Head: Normocephalic and atraumatic.  Right Ear: Tympanic membrane is injected and erythematous. A middle ear effusion is present.  Left Ear: Tympanic membrane is injected and erythematous. A middle ear effusion is present.  Nose: Mucosal edema present. Right sinus exhibits no maxillary sinus tenderness and no frontal sinus tenderness. Left sinus exhibits no maxillary sinus tenderness and no frontal sinus tenderness.  Mouth/Throat: Uvula is midline. Posterior oropharyngeal erythema present. Tonsils are 1+ on the right. Tonsils are 1+ on the left.  Ear erythema and effusion left greater than right  Eyes: EOM are  normal. Pupils are equal, round, and reactive to light.  Neck: Normal range of motion. Neck supple.  Cardiovascular: Normal rate and regular rhythm.  Pulmonary/Chest: Effort normal and breath sounds normal. No respiratory distress.  Abdominal: Soft. There is tenderness.  Lymphadenopathy:    He has no cervical adenopathy.  Neurological: He is alert and oriented to person, place, and time.  Skin: Skin is warm and dry.  Psychiatric: He has a normal mood and affect. His behavior is normal.     UC Treatments / Results  Labs (all labs ordered are listed, but only abnormal results are displayed) Labs Reviewed  RAPID STREP SCREEN (NOT AT [facility])  CULTURE, GROUP A STREP [facility])    EKG  EKG Interpretation None       Radiology No results found.  Procedures Procedures (including critical care time)  Medications Ordered in UC Medications - No data to display   Initial Impression / Assessment and Plan / UC Course  I have reviewed the triage vital signs and the nursing notes.  Pertinent labs & imaging results that were available during my care of the patient were reviewed by me and considered in my medical decision making (see chart for details).     Patient with sore throat that started this morning has progressively worsened through the day.  Patient also with left ear pain.  On exam, patient has bilateral otitis media, left greater than right.  Patient also has erythema and swelling to the throat.  Rapid strep was negative but will send for culture.  Given the patient's borderline fever of 100 and heart rate of 100 we will go ahead and treat him with antibiotic for his ear infection which would cover any possible strep pharyngitis as well.  Advised patient also try Flonase.  Will prescribe him Zyrtec for the drainage.  Advised to push fluids and take ibuprofen Tylenol as needed for pain and fever.  Final Clinical Impressions(s) / UC Diagnoses   Final diagnoses:  Sore throat  Acute  suppurative otitis media of both ears without spontaneous rupture of tympanic membranes, recurrence not specified  Nasal congestion    ED Discharge Orders        Ordered    azithromycin (ZITHROMAX Z-PAK) 250 MG tablet     12/21/17 1852    cetirizine (ZYRTEC) 10 MG tablet  Daily     12/21/17 1852       Controlled Substance Prescriptions  [place] Controlled Substance Registry consulted? Not Applicable   Luvenia Redden, PA-C 12/21/17 1561

## 2017-12-21 NOTE — ED Triage Notes (Signed)
Patient c/o sore throat / cough x yesterday.

## 2017-12-24 LAB — CULTURE, GROUP A STREP (THRC)

## 2020-01-07 ENCOUNTER — Telehealth: Payer: Self-pay | Admitting: Physician Assistant

## 2020-01-07 NOTE — Telephone Encounter (Signed)
[facility] called about the prior authorization.  They have another question.  Phone # is [phone]

## 2020-01-08 NOTE — Telephone Encounter (Signed)
Prior Authorization form filled out and faxed back to [place] @ [phone]

## 2020-01-11 NOTE — Telephone Encounter (Signed)
Phone call to [place] @ [phone] to see if patients Prior Authorization for the Tremfya has been approved.  Per Devin with [facility] the patient's Prior Authorization has been approved and the approval dates are 01/05/2020-01/04/2021 approval number [id number].

## 2020-01-18 ENCOUNTER — Ambulatory Visit: Payer: BC Managed Care – PPO | Attending: Internal Medicine

## 2020-01-18 DIAGNOSIS — Z23 Encounter for immunization: Secondary | ICD-10-CM

## 2020-01-18 NOTE — Progress Notes (Signed)
   Covid-19 Vaccination Clinic  Name:  Samuel Horton    MRN: [id number] DOB: 02-25-1986  01/18/2020  Samuel Horton was observed post Covid-19 immunization for 15 minutes without incident. He was provided with Vaccine Information Sheet and instruction to access the V-Safe system.   Samuel Horton was instructed to call 911 with any severe reactions post vaccine: Marland Kitchen Difficulty breathing  . Swelling of face and throat  . A fast heartbeat  . A bad rash all over body  . Dizziness and weakness   Immunizations Administered    Name Date Dose VIS Date Route   Pfizer COVID-19 Vaccine 01/18/2020  2:45 PM 0.3 mL 10/05/2019 Intramuscular   Manufacturer: [place]   Lot: [id number]   [place]: [id number]

## 2020-02-12 ENCOUNTER — Ambulatory Visit: Payer: BC Managed Care – PPO

## 2020-02-13 ENCOUNTER — Ambulatory Visit: Payer: BC Managed Care – PPO | Attending: Internal Medicine

## 2020-02-13 DIAGNOSIS — Z23 Encounter for immunization: Secondary | ICD-10-CM

## 2020-02-13 NOTE — Progress Notes (Signed)
   Covid-19 Vaccination Clinic  Name:  LAINE RASLER    MRN: [id number] DOB: [birth year]  02/13/2020  Mr. Mccully was observed post Covid-19 immunization for 30 minutes based on pre-vaccination screening without incident. He was provided with Vaccine Information Sheet and instruction to access the V-Safe system.   Mr. Valliere was instructed to call 911 with any severe reactions post vaccine: Marland Kitchen Difficulty breathing  . Swelling of face and throat  . A fast heartbeat  . A bad rash all over body  . Dizziness and weakness   Immunizations Administered    Name Date Dose VIS Date Route   Pfizer COVID-19 Vaccine 02/13/2020  1:03 PM 0.3 mL 12/19/2018 Intramuscular   Manufacturer: [place]   Lot: [id number]   [place]: [id number]

## 2020-03-03 ENCOUNTER — Ambulatory Visit: Payer: BC Managed Care – PPO | Admitting: Physician Assistant

## 2020-03-03 ENCOUNTER — Other Ambulatory Visit: Payer: Self-pay

## 2020-03-03 ENCOUNTER — Encounter: Payer: Self-pay | Admitting: Physician Assistant

## 2020-03-03 DIAGNOSIS — L723 Sebaceous cyst: Secondary | ICD-10-CM | POA: Diagnosis not present

## 2020-03-03 MED ORDER — TRIAMCINOLONE ACETONIDE 10 MG/ML IJ SUSP
10.0000 mg | Freq: Once | INTRAMUSCULAR | Status: AC
  Administered 2020-03-03: 10 mg

## 2020-03-03 NOTE — Progress Notes (Signed)
   Follow up Visit  Subjective  Samuel Horton is a 34 y.o. male who presents for the following: Skin Problem (back of neckx 6 months-getting bigger-painful). He has noticed the bump for 6 months. Started yesterday being sore and has grown since yesterday. He doesn't know of anything that rubbed on it or caused it to get irritated.    Objective  Well appearing patient in no apparent distress; mood and affect are within normal limits.  A focused examination was performed including posterior neck. Relevant physical exam findings are noted in the Assessment and Plan. No suspicious moles noted on back.   Objective  Neck - Posterior: Inflamed nodule right posterior shoulder at base of neck.  Assessment & Plan  Inflamed sebaceous cyst Neck - Posterior  Incision and Drainage - Neck - Posterior Location: right posterior shoulder   Informed Consent: Discussed risks (permanent scarring, light or dark discoloration, infection, pain, bleeding, bruising, redness, damage to adjacent structures, and recurrence of the lesion) and benefits of the procedure, as well as the alternatives.  Informed consent was obtained.  Preparation: The area was prepped with chlorhexidine.  Anesthesia: Lidocaine 1% with epinephrine  Procedure Details: An incision was made overlying the lesion. The lesion drained white, chalky cyst material.  Pieces of cyst wall were extracted along with cystic contents.   Antibiotic ointment and a sterile pressure dressing were applied. The patient tolerated procedure well.  Total number of lesions drained: 1  Plan: The patient was instructed on post-op care. Recommend OTC analgesia as needed for pain.   Intralesional injection - Neck - Posterior Location: right posterior shoulder  Informed Consent: Discussed risks (infection, pain, bleeding, bruising, thinning of the skin, loss of skin pigment,  Indentation, lack of resolution, and recurrence of lesion) and benefits of  the procedure, as well as the alternatives. Informed consent was obtained. Preparation: The area was prepared in a standard fashion.   Procedure Details: An intralesional injection was performed with Kenalog 10 mg/cc. 0.1 cc in total were injected.  Total number of injections: 1  Plan: The patient was instructed on post-op care. Recommend OTC analgesia as needed for pain.

## 2020-10-08 ENCOUNTER — Ambulatory Visit (INDEPENDENT_AMBULATORY_CARE_PROVIDER_SITE_OTHER): Payer: BC Managed Care – PPO | Admitting: Physician Assistant

## 2020-10-08 ENCOUNTER — Other Ambulatory Visit: Payer: Self-pay

## 2020-10-08 ENCOUNTER — Encounter: Payer: Self-pay | Admitting: Physician Assistant

## 2020-10-08 DIAGNOSIS — Z86018 Personal history of other benign neoplasm: Secondary | ICD-10-CM

## 2020-10-08 DIAGNOSIS — L409 Psoriasis, unspecified: Secondary | ICD-10-CM | POA: Diagnosis not present

## 2020-10-08 DIAGNOSIS — D2272 Melanocytic nevi of left lower limb, including hip: Secondary | ICD-10-CM | POA: Diagnosis not present

## 2020-10-08 DIAGNOSIS — D485 Neoplasm of uncertain behavior of skin: Secondary | ICD-10-CM

## 2020-10-08 NOTE — Progress Notes (Signed)
   Follow-Up Visit   Subjective  Samuel Horton is a 34 y.o. male who presents for the following: Psoriasis (Here for tremfya follow up doing well no concerns. ).   The following portions of the chart were reviewed this encounter and updated as appropriate:  Tobacco  Allergies  Meds  Problems  Med Hx  Surg Hx  Fam Hx      Objective  Well appearing patient in no apparent distress; mood and affect are within normal limits.  All skin waist up examined.  Objective  Mid Back, elbows, legs, knuckles, scalp: Well-marginated erythematous papules/plaques with silvery scale are mostly clear today.  Scalp with a small amount of scale. No dactylitis, tenosynovitis noted. No diabetes or heart disease.  Objective  lateral distal metatarsal: Black macule     Objective  Mid Back: Dyspigmented scar.    Assessment & Plan  Psoriasis Mid Back, elbows, legs, knuckles, scalp  CBC with Differential/Platelets - Mid Back, elbows, legs, knuckles, scalp  CMP - Mid Back, elbows, legs, knuckles, scalp  QuantiFERON-TB Gold Plus - Mid Back, elbows, legs, knuckles, scalp  Neoplasm of uncertain behavior of skin lateral distal metatarsal  Skin / nail biopsy Type of biopsy: tangential   Informed consent: discussed and consent obtained   Timeout: patient name, date of birth, surgical site, and procedure verified   Procedure prep:  Patient was prepped and draped in usual sterile fashion (Non sterile) Prep type:  Chlorhexidine Anesthesia: the lesion was anesthetized in a standard fashion   Anesthetic:  1% lidocaine w/ epinephrine 1-100,000 local infiltration Instrument used: flexible razor blade   Outcome: patient tolerated procedure well   Post-procedure details: wound care instructions given    Specimen 1 - Surgical pathology Differential Diagnosis: DN, atypia  Check Margins: yes 2 pieces in bottle  History of dysplastic nevus Mid Back  Yearly skin exams    I,  Anjelita Sheahan, PA-C, have reviewed all documentation's for this visit.  The documentation on 10/08/20 for the exam, diagnosis, procedures and orders are all accurate and complete.

## 2020-10-08 NOTE — Patient Instructions (Signed)

## 2020-10-10 LAB — CBC WITH DIFFERENTIAL/PLATELET
Absolute Monocytes: 587 cells/uL (ref 200–950)
Basophils Absolute: 20 cells/uL (ref 0–200)
Basophils Relative: 0.3 %
Eosinophils Absolute: 158 cells/uL (ref 15–500)
Eosinophils Relative: 2.4 %
HCT: 43.3 % (ref 38.5–50.0)
Hemoglobin: 15 g/dL (ref 13.2–17.1)
Lymphs Abs: 1822 cells/uL (ref 850–3900)
MCH: 30.1 pg (ref 27.0–33.0)
MCHC: 34.6 g/dL (ref 32.0–36.0)
MCV: 86.9 fL (ref 80.0–100.0)
MPV: 11.5 fL (ref 7.5–12.5)
Monocytes Relative: 8.9 %
Neutro Abs: 4013 cells/uL (ref 1500–7800)
Neutrophils Relative %: 60.8 %
Platelets: 268 10*3/uL (ref 140–400)
RBC: 4.98 10*6/uL (ref 4.20–5.80)
RDW: 12.2 % (ref 11.0–15.0)
Total Lymphocyte: 27.6 %
WBC: 6.6 10*3/uL (ref 3.8–10.8)

## 2020-10-10 LAB — QUANTIFERON-TB GOLD PLUS
Mitogen-NIL: >10 [IU]/mL
NIL: 0.04 [IU]/mL
QuantiFERON-TB Gold Plus: NEGATIVE
TB1-NIL: 0 [IU]/mL
TB2-NIL: 0 [IU]/mL

## 2020-10-10 LAB — COMPREHENSIVE METABOLIC PANEL
AG Ratio: 2 (calc) (ref 1.0–2.5)
ALT: 37 U/L (ref 9–46)
AST: 28 U/L (ref 10–40)
Albumin: 4.7 g/dL (ref 3.6–5.1)
Alkaline phosphatase (APISO): 76 U/L (ref 36–130)
BUN: 9 mg/dL (ref 7–25)
CO2: 29 mmol/L (ref 20–32)
Calcium: 9.2 mg/dL (ref 8.6–10.3)
Chloride: 102 mmol/L (ref 98–110)
Creat: 0.99 mg/dL (ref 0.60–1.35)
Globulin: 2.3 g/dL (calc) (ref 1.9–3.7)
Glucose, Bld: 78 mg/dL (ref 65–99)
Potassium: 4.4 mmol/L (ref 3.5–5.3)
Sodium: 138 mmol/L (ref 135–146)
Total Bilirubin: 0.5 mg/dL (ref 0.2–1.2)
Total Protein: 7 g/dL (ref 6.1–8.1)

## 2020-10-13 ENCOUNTER — Telehealth: Payer: Self-pay | Admitting: *Deleted

## 2020-10-13 NOTE — Telephone Encounter (Signed)
Labs and pathology to patient. Made a widershave appointment with Robyne Askew PA.

## 2020-10-13 NOTE — Telephone Encounter (Signed)
-----   Message from Warren Danes, [place] sent at 10/13/2020 12:45 PM EST ----- WS 15 minutes

## 2020-11-06 ENCOUNTER — Other Ambulatory Visit: Payer: BC Managed Care – PPO

## 2020-11-08 ENCOUNTER — Other Ambulatory Visit: Payer: Self-pay

## 2020-11-08 DIAGNOSIS — Z20822 Contact with and (suspected) exposure to covid-19: Secondary | ICD-10-CM

## 2020-11-11 LAB — NOVEL CORONAVIRUS, NAA: SARS-CoV-2, NAA: DETECTED — AB

## 2020-11-12 ENCOUNTER — Telehealth: Payer: Self-pay

## 2020-11-12 NOTE — Telephone Encounter (Signed)
Called to discuss with patient about COVID-19 symptoms and the use of one of the available treatments for those with mild to moderate Covid symptoms and at a high risk of hospitalization.  Pt appears to qualify for outpatient treatment due to co-morbid conditions and/or a member of an at-risk group in accordance with the FDA Emergency Use Authorization.    Symptom onset:Unknown Vaccinated: Yes Booster? Unknown Immunocompromised? No Qualifiers: None  Unable to reach pt - Left message and call back number [phone].  Marcello Moores

## 2020-12-15 ENCOUNTER — Telehealth: Payer: Self-pay

## 2020-12-15 MED ORDER — TREMFYA 100 MG/ML ~~LOC~~ SOSY: 1.0000 | PREFILLED_SYRINGE | SUBCUTANEOUS | 2 refills | Status: DC

## 2020-12-15 NOTE — Telephone Encounter (Signed)
Patient had follow up early March TB needed at this [facility]

## 2020-12-31 ENCOUNTER — Ambulatory Visit: Payer: BC Managed Care – PPO | Admitting: Physician Assistant

## 2020-12-31 ENCOUNTER — Other Ambulatory Visit: Payer: Self-pay

## 2020-12-31 ENCOUNTER — Encounter: Payer: Self-pay | Admitting: Physician Assistant

## 2020-12-31 DIAGNOSIS — D485 Neoplasm of uncertain behavior of skin: Secondary | ICD-10-CM

## 2020-12-31 NOTE — Patient Instructions (Signed)

## 2021-01-06 ENCOUNTER — Telehealth: Payer: Self-pay | Admitting: *Deleted

## 2021-01-06 NOTE — Telephone Encounter (Signed)
Faxed over office notes to [facility] Fate for approval [phone]

## 2021-01-06 NOTE — Telephone Encounter (Signed)
Prior Authorization needed for Tremfya.  Done via Cover my meds.  Samuel Horton (Key: [id number])  Your information has been submitted to [place]. To check for an updated outcome later, reopen this PA request from your dashboard.  If [facility] has not responded to your request within 24 hours, contact [place] at [phone]. If you think there may be a problem with your PA request, use our live chat feature at the bottom right.

## 2021-01-15 ENCOUNTER — Encounter: Payer: Self-pay | Admitting: Physician Assistant

## 2021-01-15 NOTE — Progress Notes (Addendum)
   Follow-Up Visit   Subjective  Samuel Horton is a 35 y.o. male who presents for the following: Follow-up (Wider shave path in the door ).   The following portions of the chart were reviewed this encounter and updated as appropriate:  Tobacco  Allergies  Meds  Problems  Med Hx  Surg Hx  Fam Hx      Objective  Well appearing patient in no apparent distress; mood and affect are within normal limits.  A full examination was performed including scalp, head, eyes, ears, nose, lips, neck, chest, axillae, abdomen, back, buttocks, bilateral upper extremities, bilateral lower extremities, hands, feet, fingers, toes, fingernails, and toenails. All findings within normal limits unless otherwise noted below.  Objective  Left Lateral distal metatarsal: Here for wider shave  Assessment & Plan  Neoplasm of uncertain behavior of skin Left Lateral distal metatarsal  Epidermal / dermal shaving  Lesion diameter (cm):  1.4 Informed consent: discussed and consent obtained   Timeout: patient name, date of birth, surgical site, and procedure verified   Procedure prep:  Patient was prepped and draped in usual sterile fashion Prep type:  Chlorhexidine Anesthesia: the lesion was anesthetized in a standard fashion   Anesthetic:  1% lidocaine w/ epinephrine 1-100,000 local infiltration Instrument used: DermaBlade   Hemostasis achieved with: aluminum chloride   Outcome: patient tolerated procedure well   Post-procedure details: sterile dressing applied and wound care instructions given   Dressing type: petrolatum gauze, petrolatum and bandage    Specimen 1 - Surgical pathology Differential Diagnosis: atypia [id number] Check Margins: No   I, Lennette Fader, PA-C, have reviewed all documentation's for this visit.  The documentation on 01/27/21 for the exam, diagnosis, procedures and orders are all accurate and complete.

## 2021-01-27 ENCOUNTER — Encounter: Payer: Self-pay | Admitting: Physician Assistant

## 2021-01-27 NOTE — Addendum Note (Signed)
Addended by: Robyne Askew R on: 01/27/2021 04:10 PM   Modules accepted: Orders

## 2021-06-22 ENCOUNTER — Telehealth: Payer: Self-pay | Admitting: Physician Assistant

## 2021-06-22 NOTE — Telephone Encounter (Signed)
Christine from [facility] called and left VM informing that they have sent several refills requests for patient Tremfya with no response. She states that patient's medication was due to be sent on the 25th of this month.

## 2021-06-24 ENCOUNTER — Telehealth: Payer: Self-pay | Admitting: *Deleted

## 2021-06-24 NOTE — Telephone Encounter (Signed)
Gave verbal order to pharmacist at [facility] because they states tremya script needed a new one- gave verbal order and called patient to notified him that I gave new prescription and that he is due in December for office visit and labwork- told patient to cal [place] if needed.

## 2021-10-07 ENCOUNTER — Encounter: Payer: Self-pay | Admitting: Physician Assistant

## 2021-10-07 ENCOUNTER — Other Ambulatory Visit: Payer: Self-pay

## 2021-10-07 ENCOUNTER — Ambulatory Visit: Payer: BC Managed Care – PPO | Admitting: Physician Assistant

## 2021-10-07 ENCOUNTER — Telehealth: Payer: Self-pay | Admitting: *Deleted

## 2021-10-07 DIAGNOSIS — Z1283 Encounter for screening for malignant neoplasm of skin: Secondary | ICD-10-CM

## 2021-10-07 DIAGNOSIS — Z86018 Personal history of other benign neoplasm: Secondary | ICD-10-CM

## 2021-10-07 DIAGNOSIS — L409 Psoriasis, unspecified: Secondary | ICD-10-CM | POA: Diagnosis not present

## 2021-10-07 MED ORDER — SKYRIZI PEN 150 MG/ML ~~LOC~~ SOAJ: 150.0000 mg | SUBCUTANEOUS | 1 refills | Status: DC

## 2021-10-07 NOTE — Progress Notes (Signed)
New start skyrizi. Patient is going to check with his job and primary care to see if they can do PPD skin TB test for him. He is going to call us.

## 2021-10-07 NOTE — Telephone Encounter (Signed)
[facility] fax stating the have received referral nad are processing [facility] prescription, will reach out if they need further information.

## 2021-10-07 NOTE — Progress Notes (Signed)
° °  Follow-Up Visit   Subjective  Samuel Horton is a 35 y.o. male who presents for the following: Annual Exam (35 yo male here for yrly skin exam and psoriasis. Pt states that there is no personal or family hx of melanoma or non melanoma skin cancer. He has had a dysplastic nevus ) Pt. states that his psoriasis if flaring. He has a large area on his left thigh and forehead. He has been having more breakouts.   The following portions of the chart were reviewed this encounter and updated as appropriate:  Tobacco   Allergies   Meds   Problems   Med Hx   Surg Hx   Fam Hx       Objective  Well appearing patient in no apparent distress; mood and affect are within normal limits.  A full examination was performed including scalp, head, eyes, ears, nose, lips, neck, chest, axillae, abdomen, back, buttocks, bilateral upper extremities, bilateral lower extremities, hands, feet, fingers, toes, fingernails, and toenails. All findings within normal limits unless otherwise noted below.  Head to toe No atypical nevi No signs of non-mole skin cancer. Dyspigmented scar clear.   Left Dorsal Hand, Left Elbow - Posterior, Left Forehead, Left Leg, Mid Forehead, Right Dorsal Hand, Right Elbow - Posterior, Right Forehead, Right Leg, Torso - Posterior (Back) Oval plaque with significant erythema and scale.    Assessment & Plan  Screening exam for skin cancer Head to toe  Yearly skin exam   Psoriasis Left Elbow - Posterior; Right Elbow - Posterior; Torso - Posterior (Back); Left Dorsal Hand; Right Dorsal Hand; Mid Forehead; Left Forehead; Right Forehead; Left Leg; Right Leg  Mid back, B/L elbows and legs(thigh), Knuckles, forehead   New start skyrizi. Patient is going to check with his job and primary care to see if they can do PPD skin TB test for him. He is going to call us.   Related Medications Risankizumab-rzaa (SKYRIZI PEN) 150 MG/ML SOAJ Inject 150 mg into the skin as directed. At weeks 0 &  4.    I, Shaleigh Laubscher, PA-C, have reviewed all documentation's for this visit.  The documentation on 10/07/21 for the exam, diagnosis, procedures and orders are all accurate and complete.

## 2021-10-12 ENCOUNTER — Telehealth: Payer: Self-pay

## 2021-10-12 DIAGNOSIS — L409 Psoriasis, unspecified: Secondary | ICD-10-CM

## 2021-10-12 NOTE — Telephone Encounter (Signed)
Phone call to patient to see if he was able to get his TB test done. Per patient he went to [facility] and got the test done on Friday. I asked patient to have the results faxed over to [place] once he receives them. Patient states that he will have the results faxed to [place].

## 2021-10-13 NOTE — Telephone Encounter (Signed)
Faxed office note to [facility]

## 2021-10-14 ENCOUNTER — Encounter: Payer: Self-pay | Admitting: Physician Assistant

## 2021-10-14 NOTE — Addendum Note (Signed)
Addended by: Sheran Lawless on: 10/14/2021 07:43 AM   Modules accepted: Orders

## 2021-10-14 NOTE — Telephone Encounter (Signed)
Faxed tb gold results to [facility]

## 2021-10-15 NOTE — Telephone Encounter (Signed)
Approval with skyrizi 10-14-21  10-14-22 Rosie Fate PA [facility] STATE PLAN [phone] [facility]

## 2021-10-22 ENCOUNTER — Other Ambulatory Visit: Payer: Self-pay | Admitting: *Deleted

## 2021-10-22 DIAGNOSIS — L409 Psoriasis, unspecified: Secondary | ICD-10-CM

## 2021-10-22 MED ORDER — SKYRIZI PEN 150 MG/ML ~~LOC~~ SOAJ
150.0000 mg | SUBCUTANEOUS | 1 refills | Status: DC
Start: 2021-10-22 — End: 2022-05-31

## 2022-05-31 ENCOUNTER — Telehealth: Payer: Self-pay

## 2022-05-31 DIAGNOSIS — L409 Psoriasis, unspecified: Secondary | ICD-10-CM

## 2022-05-31 MED ORDER — SKYRIZI PEN 150 MG/ML ~~LOC~~ SOAJ: 150.0000 mg | SUBCUTANEOUS | 1 refills | Status: AC

## 2022-05-31 NOTE — Telephone Encounter (Signed)
Refills skyrizi

## 2022-05-31 NOTE — Telephone Encounter (Signed)
Sent over clinical documents via [facility] portal for skyrizi refills.

## 2022-06-01 ENCOUNTER — Telehealth: Payer: Self-pay | Admitting: *Deleted

## 2022-06-01 NOTE — Telephone Encounter (Signed)
Patients plan restricts filling of this appointment to [facility] specialty pharmacy. Iantha Fallen will transfer prescription [facility] at [phone] ph & fax [phone]. If eligible [facility] will activate copay card for the patient to be included in transferred information. If patient is required to enroll themselves, we will provide the patient with instructions on how to obtain a copay card.

## 2022-10-07 ENCOUNTER — Ambulatory Visit: Payer: BC Managed Care – PPO | Admitting: Physician Assistant

## 2023-05-24 ENCOUNTER — Ambulatory Visit: Payer: 59 | Admitting: Urology

## 2023-05-24 ENCOUNTER — Encounter: Payer: Self-pay | Admitting: Urology

## 2023-05-24 VITALS — BP 157/99 | HR 81 | Ht 71.0 in | Wt 195.8 lb

## 2023-05-24 DIAGNOSIS — Z3009 Encounter for other general counseling and advice on contraception: Secondary | ICD-10-CM

## 2023-05-24 MED ORDER — DIAZEPAM 5 MG PO TABS: 5.0000 mg | ORAL_TABLET | Freq: Once | ORAL | 0 refills | Status: DC | PRN

## 2023-05-24 NOTE — Patient Instructions (Addendum)

## 2023-05-24 NOTE — Progress Notes (Signed)
   05/24/23 12:02 PM   Deane Cammie Sickle [birth year] [id number]  CC: Discuss vasectomy  HPI: Healthy 37 year old male with 1 child who does not desire any further biologic pregnancies.  He denies any urinary symptoms, no family history of prostate cancer.  Interested in vasectomy for permanent sterilization.   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 He has never used smokeless tobacco. He reports current alcohol use of about 6.0 standard drinks of alcohol per week. He reports that he does not use drugs.  Physical Exam: BP (!) 157/99 (BP Location: Left Arm, Patient Position: Sitting, Cuff Size: Normal)   Pulse 81   Ht 5\' 11"  (1.803 m)   Wt 195 lb 12.8 oz (88.8 kg)   BMI 27.31 kg/m    Constitutional:  Alert and oriented, No acute distress. Cardiovascular: No clubbing, cyanosis, or edema. Respiratory: Normal respiratory effort, no increased work of breathing. GI: Abdomen is soft, nontender, nondistended, no abdominal masses GU: Phallus with patent meatus, no lesions, vas deferens easily palpable bilaterally, testicles without masses  Assessment & Plan:   Healthy 37 year old male with 1 child who does not desire further biologic pregnancies.  We discussed the risks and benefits of vasectomy at length.  Vasectomy is intended to be a permanent form of contraception, and does not produce immediate sterility.  Following vasectomy another form of contraception is required until vas occlusion is confirmed by a post-vasectomy semen analysis obtained 2-3 months after the  procedure.  Even after vas occlusion is confirmed, vasectomy is not 100% reliable in preventing pregnancy, and the failure rate is approximately 10/1998.  Repeat vasectomy is required in less than 1% of patients.  He should refrain from ejaculation for 1 week after vasectomy.  Options for fertility after vasectomy include vasectomy reversal, and sperm retrieval with in vitro fertilization or ICSI.  These options are not always successful and may be expensive.  Finally, there are other permanent and non-permanent alternatives to vasectomy available. There is no risk of erectile dysfunction, and the volume of semen will be similar to prior, as the majority of the ejaculate is from the prostate and seminal vesicles.   The procedure takes ~20 minutes.  We recommend patients take 5-10 mg of Valium 30 minutes prior, and he will need a driver post-procedure.  Local anesthetic is injected into the scrotal skin and a small segment of the vas deferens is removed, and the ends occluded. The complication rate is approximately 1-2%, and includes bleeding, infection, and development of chronic scrotal pain.  PLAN: Schedule vasectomy Valium sent to pharmacy   Legrand Rams, MD 05/24/2023  [facility] [address], [address] [place], [place] [address] [phone]

## 2023-07-01 ENCOUNTER — Ambulatory Visit
Admission: RE | Admit: 2023-07-01 | Discharge: 2023-07-01 | Disposition: A | Discharge status: Home / Self Care | Payer: 59 | Source: Ambulatory Visit | Attending: Family Medicine | Admitting: Family Medicine

## 2023-07-01 VITALS — BP 123/76 | HR 90 | Temp 98.9°F | Resp 15 | Ht 71.0 in | Wt 190.0 lb

## 2023-07-01 DIAGNOSIS — U071 COVID-19: Secondary | ICD-10-CM

## 2023-07-01 LAB — SARS CORONAVIRUS 2 BY RT PCR: SARS Coronavirus 2 by RT PCR: POSITIVE — AB

## 2023-07-01 MED ORDER — LIDOCAINE VISCOUS HCL 2 % MT SOLN: 15.0000 mL | OROMUCOSAL | 0 refills | Status: DC | PRN

## 2023-07-01 MED ORDER — BENZONATATE 100 MG PO CAPS: 100.0000 mg | ORAL_CAPSULE | Freq: Three times a day (TID) | ORAL | 0 refills | Status: DC

## 2023-07-01 MED ORDER — PROMETHAZINE-DM 6.25-15 MG/5ML PO SYRP: 5.0000 mL | ORAL_SOLUTION | Freq: Four times a day (QID) | ORAL | 0 refills | Status: DC | PRN

## 2023-07-01 NOTE — ED Triage Notes (Signed)
Patient c/o cough, nasal congestion, bodyaches and headache that started Tuesday night.  Patient unsure of fevers.

## 2023-07-01 NOTE — ED Provider Notes (Signed)
[facility]    CSN: [id number] Arrival date & time: 07/01/23  1149      History   Chief Complaint Chief Complaint  Patient presents with   Cough    Appointment    HPI Samuel Horton is a 37 y.o. male.   HPI  History obtained from the patient. Samuel Horton presents for cough, nasal congestion, rhinorrhea, body aches, headache that started on Tuesday.  He undocumented fevers.  Took some NyQuil for his symptoms.  Was not able to sleep much last night due to his symptoms.  No known sick contacts.   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 Authorizing Provider  benzonatate (TESSALON) 100 MG capsule Take 1 capsule (100 mg total) by mouth every 8 (eight) hours. 07/01/23  Yes Choya Tornow, DO  lidocaine (XYLOCAINE) 2 % solution Use as directed 15 mLs in the mouth or throat every 4 (four) hours as needed for mouth pain. 07/01/23  Yes Shakelia Scrivner, DO  promethazine-dextromethorphan (PROMETHAZINE-DM) 6.25-15 MG/5ML syrup Take 5 mLs by mouth 4 (four) times daily as needed. 07/01/23  Yes  Amiee Wiley, DO  Risankizumab-rzaa (SKYRIZI PEN) 150 MG/ML SOAJ Inject 150 mg into the skin as directed. Inject 1 pen every 12 weeks 05/31/22  Yes Janalyn Harder, MD  diazepam (VALIUM) 5 MG tablet Take 1 tablet (5 mg total) by mouth once as needed for up to 1 dose (take 45 minutes prior to vasectomy). 05/24/23   Sondra Come, MD    Family History Family History  Problem Relation Age of Onset   Breast cancer Maternal Grandmother    Hyperlipidemia Maternal Grandfather    Mental illness Paternal Grandmother     Social History Social History   Tobacco Use   Smoking status: Former   Smokeless tobacco: Never  Vaping Use   Vaping status: Never Used  Substance Use Topics   Alcohol use: Yes    Alcohol/week: 6.0 standard drinks of alcohol    Types: 6 Standard drinks or equivalent per week   Drug use: No     Allergies   Cefaclor   Review of Systems Review of Systems: negative unless otherwise stated in HPI.      Physical Exam Triage Vital Signs ED Triage Vitals  Encounter Vitals Group     BP 07/01/23 1221 123/76     Systolic BP Percentile --      Diastolic BP Percentile --      Pulse Rate 07/01/23 1221 90  Resp 07/01/23 1221 15     Temp 07/01/23 1221 98.9 F (37.2 C)     Temp Source 07/01/23 1221 Oral     SpO2 07/01/23 1221 100 %     Weight 07/01/23 1219 190 lb (86.2 kg)     Height 07/01/23 1219 5\' 11"  (1.803 m)     Head Circumference --      Peak Flow --      Pain Score 07/01/23 1219 0     Pain Loc --      Pain Education --      Exclude from Growth Chart --    No data found.  Updated Vital Signs BP 123/76 (BP Location: Left Arm)   Pulse 90   Temp 98.9 F (37.2 C) (Oral)   Resp 15   Ht 5\' 11"  (1.803 m)   Wt 86.2 kg   SpO2 100%   BMI 26.50 kg/m   Visual Acuity Right Eye Distance:   Left Eye Distance:   Bilateral Distance:    Right Eye Near:   Left Eye Near:    Bilateral Near:     Physical Exam GEN:     alert, non-toxic appearing male in  no distress    HENT:  mucus membranes moist, oropharyngeal without lesions or erythema, no tonsillar hypertrophy or exudates, clear nasal discharge EYES:   pupils equal and reactive, no scleral injection or discharge NECK:  normal ROM,no meningismus   RESP:  no increased work of breathing, clear to auscultation bilaterally CVS:   regular rate and rhythm Skin:   warm and dry    UC Treatments / Results  Labs (all labs ordered are listed, but only abnormal results are displayed) Labs Reviewed  SARS CORONAVIRUS 2 BY RT PCR - Abnormal; Notable for the following components:      Result Value   SARS Coronavirus 2 by RT PCR POSITIVE (*)    All other components within normal limits    EKG   Radiology No results found.  Procedures Procedures (including critical care time)  Medications Ordered in UC Medications - No data to display  Initial Impression / Assessment and Plan / UC Course  I have reviewed the triage vital signs and the nursing notes.  Pertinent labs & imaging results that were available during my care of the patient were reviewed by me and considered in my medical decision making (see chart for details).       Pt is a 37 y.o. male who presents for 2-3 days of respiratory symptoms. Samuel Horton is afebrile here without recent antipyretics. Satting well on room air. Overall pt is non-toxic appearing, well hydrated, without respiratory distress. Pulmonary exam is unremarkable.  COVID testing obtained and is positive. He is >48 hours into his COVID symptoms therefore did not recommend antiviral therapy. Discussed symptomatic treatment.  Explained lack of efficacy of antibiotics in viral disease.  Typical duration of symptoms discussed.  Promethazine DM and Tessalon Perles for cough.  Lidocaine gel for sore throat.  Work note offered but not needed.  Return and ED precautions given and voiced understanding. Discussed MDM, treatment plan and plan for follow-up with patient who agrees  with plan.     Final Clinical Impressions(s) / UC Diagnoses   Final diagnoses:  COVID-19     Discharge Instructions      Your test for COVID-19 was positive, meaning that you were infected with the novel coronavirus and could give the germ to others.  The recommendations suggest returning  to normal activities when, for at least 24 hours, symptoms are improving overall, and if a fever was present, it has been gone without use of a fever-reducing medication.  You should wear a mask for the next 5 days to prevent the spread of disease. Please continue good preventive care measures, including:  frequent hand-washing, avoid touching your face, cover coughs/sneezes, stay out of crowds and keep a 6 foot distance from others.  Go to the nearest hospital emergency room if fever/cough/breathlessness are severe or illness seems like a threat to life.  If your were prescribed medication. Stop by the pharmacy to pick it up. You can take Tylenol and/or Ibuprofen as needed for fever reduction and pain relief.    For cough: honey 1/2 to 1 teaspoon (you can dilute the honey in water or another fluid).  You can also use guaifenesin and dextromethorphan for cough. You can use a humidifier for chest congestion and cough.  If you don't have a humidifier, you can sit in the bathroom with the hot shower running.      For sore throat: try warm salt water gargles, Mucinex sore throat cough drops or cepacol lozenges, throat spray, warm tea or water with lemon/honey, popsicles or ice, or OTC cold relief medicine for throat discomfort. You can also purchase chloraseptic spray at the pharmacy or dollar store.   For congestion: take a daily anti-histamine like Zyrtec, Claritin, and a oral decongestant, such as pseudoephedrine.  You can also use Flonase 1-2 sprays in each nostril daily. Afrin is also a good option, if you do not have high blood pressure.    It is important to stay hydrated: drink plenty of fluids (water,  gatorade/powerade/pedialyte, juices, or teas) to keep your throat moisturized and help further relieve irritation/discomfort.    Return or go to the Emergency Department if symptoms worsen or do not improve in the next few days      ED Prescriptions     Medication Sig Dispense Auth. Provider   promethazine-dextromethorphan (PROMETHAZINE-DM) 6.25-15 MG/5ML syrup Take 5 mLs by mouth 4 (four) times daily as needed. 118 mL Halynn Reitano, DO   benzonatate (TESSALON) 100 MG capsule Take 1 capsule (100 mg total) by mouth every 8 (eight) hours. 21 capsule Bless Belshe, DO   lidocaine (XYLOCAINE) 2 % solution Use as directed 15 mLs in the mouth or throat every 4 (four) hours as needed for mouth pain. 100 mL Katha Cabal, DO      PDMP not reviewed this encounter.   Katha Cabal, DO 07/01/23 1303

## 2023-07-01 NOTE — Discharge Instructions (Signed)

## 2023-07-06 ENCOUNTER — Ambulatory Visit (INDEPENDENT_AMBULATORY_CARE_PROVIDER_SITE_OTHER): Payer: 59 | Admitting: Urology

## 2023-07-06 ENCOUNTER — Encounter: Payer: Self-pay | Admitting: Urology

## 2023-07-06 VITALS — BP 124/86 | HR 99 | Ht 71.0 in | Wt 194.0 lb

## 2023-07-06 DIAGNOSIS — Z302 Encounter for sterilization: Secondary | ICD-10-CM

## 2023-07-06 NOTE — Progress Notes (Signed)
Canceled for recent COVID-positive URI with positive test 9/6  Will reschedule vasectomy  Legrand Rams, MD 07/06/2023

## 2023-07-06 NOTE — Patient Instructions (Addendum)
Pre-Vasectomy Instructions ? ?STOP all aspirin or blood thinners (Aspirin, Plavix, Coumadin, Warfarin, Motrin, Ibuprofen, Advil, Aleve, Naproxen, Naprosyn) for 7 days prior to the procedure.  If you have any questions about stopping these medications please contact your primary care physician or cardiologist. ? ?Shave all hair from the upper scrotum on the day of the procedure.  This means just under the penis onto the scrotal sac.  The area shaved should measure about 2-3 inches around.  You may lather the scrotum with soap and water, and shave with a safety razor. ? ?After shaving the area, thoroughly wash the penis and the scrotum, then shower or bathe to remove all the loose hairs.  If needed, wash the area again just before coming in for your Vasectomy. ? ?It is recommended to have a light meal an hour or so prior to the procedure. ? ?Bring a scrotal support (jock strap or suspensory, or tight jockey shorts or underwear).  Wear comfortable pants or shorts. ? ?While the actual procedure usually takes about 45 minutes, you should be prepared to stay in the office for approximately one hour.  Bring someone with you to drive you home. ? ?If you have any questions or concerns, please feel free to call the office at [phone]. ?  ?

## 2023-07-21 ENCOUNTER — Ambulatory Visit: Payer: 59 | Admitting: Urology

## 2023-07-21 ENCOUNTER — Encounter: Payer: Self-pay | Admitting: Urology

## 2023-07-21 VITALS — BP 134/84 | HR 112 | Ht 71.0 in | Wt 195.0 lb

## 2023-07-21 DIAGNOSIS — Z302 Encounter for sterilization: Secondary | ICD-10-CM

## 2023-07-21 NOTE — Progress Notes (Signed)
VASECTOMY PROCEDURE NOTE:  The patient was taken to the minor procedure room and placed in the supine position. His genitals were prepped and draped in the usual sterile fashion. The right vas deferens was brought up to the skin of the right upper scrotum. The skin overlying it was anesthetized with 1% lidocaine without epinephrine, anesthetic was also injected alongside the vas deferens in the direction of the inguinal canal. The no scalpel vasectomy instrument was used to make a small perforation in the scrotal skin. The vasectomy clamp was used to grasp the vas deferens. It was carefully dissected free from surrounding structures. A 1cm segment of the vas was removed, and the cut ends of the mucosa were cauterized. No significant bleeding was noted. The vas deferens was returned to the scrotum. The skin incision was closed with a simple interrupted stitch of 4-0 chromic.  Attention was then turned to the left side. The left vasectomy was performed in the same exact fashion. Sterile dressings were placed over each incision. The patient tolerated the procedure well.  IMPRESSION/DIAGNOSIS: The patient is a 37 year old gentleman who underwent a vasectomy today. Post-procedure instructions were reviewed. I stressed the importance of continuing to use birth control until he provides a semen specimen more than 2 months from now that demonstrates azoospermia.  We discussed return precautions including fever over 101, significant bleeding or hematoma, or uncontrolled pain. I also stressed the importance of avoiding strenuous activity for one week, no sexual activity or ejaculations for 5 days, intermittent icing over the next 48 hours, and scrotal support.   PLAN: The patient will be advised of his semen analysis results when available.  Legrand Rams, MD 07/21/2023

## 2023-07-21 NOTE — Patient Instructions (Signed)

## 2023-10-20 ENCOUNTER — Other Ambulatory Visit: Payer: 59

## 2023-11-03 ENCOUNTER — Other Ambulatory Visit: Payer: 59

## 2023-11-10 ENCOUNTER — Other Ambulatory Visit: Payer: BC Managed Care – PPO

## 2023-11-10 DIAGNOSIS — Z302 Encounter for sterilization: Secondary | ICD-10-CM

## 2023-11-11 LAB — POST-VAS SPERM EVALUATION,QUAL: Volume: 3.4 mL

## 2024-07-06 ENCOUNTER — Encounter: Payer: Self-pay | Admitting: Physician Assistant

## 2024-07-06 ENCOUNTER — Ambulatory Visit (INDEPENDENT_AMBULATORY_CARE_PROVIDER_SITE_OTHER): Admitting: Physician Assistant

## 2024-07-06 VITALS — BP 120/88 | HR 82 | Temp 97.9°F | Ht 71.0 in | Wt 195.0 lb

## 2024-07-06 DIAGNOSIS — Z0000 Encounter for general adult medical examination without abnormal findings: Secondary | ICD-10-CM

## 2024-07-06 DIAGNOSIS — I861 Scrotal varices: Secondary | ICD-10-CM | POA: Diagnosis not present

## 2024-07-06 DIAGNOSIS — Z23 Encounter for immunization: Secondary | ICD-10-CM | POA: Diagnosis not present

## 2024-07-06 NOTE — Progress Notes (Signed)
 Date:  07/06/2024   Name:  Samuel Horton   DOB:  1986/05/14   MRN:  [id number]   Chief Complaint: Establish Care and Flu Vaccine (Wants flu shot )  HPI Samuel Horton is a very pleasant 38 year old male with history of psoriasis who presents to the clinic today to establish care and complete a routine physical.  He works for [employer].  Last Physical: >1y ago Last Dental Exam: 70m ago Last Eye Exam: >10y ago Immunizations Due: Tdap, influenza  He did have vasectomy with Dr. Francisca just over 1 year ago on 07/06/2023, semen analysis 11/10/2023 negative for sperm.  Since the procedure, patient endorses occasional aching scrotal pain on the right side that will sometimes last a couple days.  Medication list has been reviewed and updated.  Current Meds  Medication Sig   Risankizumab -rzaa (SKYRIZI  PEN) 150 MG/ML SOAJ Inject 150 mg into the skin as directed. Inject 1 pen every 12 weeks     Review of Systems  Patient Active Problem List   Diagnosis Date Noted   Right varicocele 07/06/2024   Psoriasis 10/16/2015    Allergies  Allergen Reactions   Cefaclor Anaphylaxis    Immunization History  Administered Date(s) Administered   Influenza, Seasonal, Injecte, Preservative Fre 07/06/2024   Influenza-Unspecified 08/26/2015, 06/13/2021   PFIZER(Purple Top)SARS-COV-2 Vaccination 01/18/2020, 02/13/2020   Tdap 07/06/2024    Past Surgical History:  Procedure Laterality Date   no surgical history     VASECTOMY     WISDOM TOOTH EXTRACTION      Social History   Tobacco Use   Smoking status: Former    Current packs/day: 0.00    Average packs/day: 0.5 packs/day for 5.0 years (2.5 ttl pk-yrs)    Types: Cigarettes    Start date: 2006    Quit date: 2011    Years since quitting: 14.7    Passive exposure: Past   Smokeless tobacco: Never  Vaping Use   Vaping status: Never Used  Substance Use Topics   Alcohol use: Yes    Alcohol/week: 6.0 standard drinks of alcohol     Types: 6 Standard drinks or equivalent per week    Comment: socially   Drug use: No    Family History  Problem Relation Age of Onset   Breast cancer Maternal Grandmother    Hyperlipidemia Maternal Grandfather    Cancer Maternal Grandfather        bile duct cancer   Mental illness Paternal Grandmother         07/06/2024    9:15 AM  GAD 7 : Generalized Anxiety Score  Nervous, Anxious, on Edge 0  Control/stop worrying 0  Worry too much - different things 0  Trouble relaxing 0  Restless 0  Easily annoyed or irritable 0  Afraid - awful might happen 0  Total GAD 7 Score 0  Anxiety Difficulty Not difficult at all       07/06/2024    9:15 AM  Depression screen PHQ 2/9  Decreased Interest 0  Down, Depressed, Hopeless 0  PHQ - 2 Score 0    BP Readings from Last 3 Encounters:  07/06/24 120/88  07/21/23 134/84  07/06/23 124/86    Wt Readings from Last 3 Encounters:  07/06/24 195 lb (88.5 kg)  07/21/23 195 lb (88.5 kg)  07/06/23 194 lb (88 kg)    BP 120/88   Pulse 82   Temp 97.9 F (36.6 C)   Ht 5' 11 (1.803 m)  Wt 195 lb (88.5 kg)   SpO2 99%   BMI 27.20 kg/m   Physical Exam Vitals and nursing note reviewed.  Constitutional:      Appearance: Normal appearance.  HENT:     Ears:     Comments: EAC clear bilaterally with good view of TM which is without effusion or erythema.     Nose: Nose normal.     Mouth/Throat:     Mouth: Mucous membranes are moist. No oral lesions.     Dentition: Normal dentition.     Pharynx: No posterior oropharyngeal erythema.  Eyes:     Extraocular Movements: Extraocular movements intact.     Conjunctiva/sclera: Conjunctivae normal.     Pupils: Pupils are equal, round, and reactive to light.  Neck:     Thyroid: No thyromegaly.  Cardiovascular:     Rate and Rhythm: Normal rate and regular rhythm.     Heart sounds: No murmur heard.    No friction rub. No gallop.     Comments: Pulses 2+ at radial, PT, DP bilaterally. No carotid  bruit. No peripheral edema Pulmonary:     Effort: Pulmonary effort is normal.     Breath sounds: Normal breath sounds.  Abdominal:     General: Bowel sounds are normal.     Palpations: Abdomen is soft. There is no mass.     Tenderness: There is no abdominal tenderness.  Genitourinary:    Penis: Normal and circumcised.      Testes:        Right: Varicocele present. Mass not present.        Left: Mass or varicocele not present.     Comments: Right testicle slightly larger than left but within normal limits.  No masses.  Mild varicocele noted on right side, mildly TTP. Musculoskeletal:     Comments: Full ROM with strength 5/5 bilateral upper and lower extremities  Lymphadenopathy:     Cervical: No cervical adenopathy.  Skin:    General: Skin is warm.     Capillary Refill: Capillary refill takes less than 2 seconds.     Findings: No lesion or rash.  Neurological:     Mental Status: He is alert and oriented to person, place, and time.     Gait: Gait is intact.  Psychiatric:        Mood and Affect: Mood normal.        Behavior: Behavior normal.     Recent Labs     Component Value Date/Time   NA 138 10/08/2020 1014   K 4.4 10/08/2020 1014   CL 102 10/08/2020 1014   CO2 29 10/08/2020 1014   GLUCOSE 78 10/08/2020 1014   BUN 9 10/08/2020 1014   CREATININE 0.99 10/08/2020 1014   CALCIUM 9.2 10/08/2020 1014   PROT 7.0 10/08/2020 1014   ALBUMIN 5.6 (H) 12/10/2015 0407   AST 28 10/08/2020 1014   ALT 37 10/08/2020 1014   ALKPHOS 69 12/10/2015 0407   BILITOT 0.5 10/08/2020 1014   GFRNONAA 57 (L) 12/10/2015 0407   GFRAA >60 12/10/2015 0407    Lab Results  Component Value Date   WBC 6.6 10/08/2020   HGB 15.0 10/08/2020   HCT 43.3 10/08/2020   MCV 86.9 10/08/2020   PLT 268 10/08/2020   No results found for: HGBA1C Lab Results  Component Value Date   CHOL 180 12/09/2015   HDL 36.30 (L) 12/09/2015   LDLCALC 132 (H) 12/09/2015   TRIG 57.0 12/09/2015   CHOLHDL 5  12/09/2015   No results found for: TSH    Assessment and Plan:  1. Annual physical exam (Primary) Encouraged healthy lifestyle including regular physical activity and consumption of whole fruits and vegetables. Encouraged routine dental and eye exams.   Check routine labs today, patient is fasting.  - CBC with Differential/Platelet - Comprehensive metabolic panel with GFR - TSH - Lipid panel - Hepatitis C antibody - HIV Antibody (routine testing w rflx)  2. Encounter for immunization Flu and Tdap immunizations given today. - Flu vaccine trivalent PF, 6mos and older(Flulaval,Afluria,Fluarix,Fluzone) - Tdap vaccine greater than or equal to 7yo IM  3. Right varicocele Patient reassured no further action is necessary at this time.  Discussed conservative management to include compression shorts and supportive underwear.  Patient to follow-up with me or urology if pain becomes persistent and bothersome.  He verbalizes understanding.     Return in about 1 year (around 07/06/2025) for CPE.    Rolan Hoyle, PA-C, DMSc, Nutritionist [facility] [phone]

## 2024-07-06 NOTE — Patient Instructions (Signed)

## 2024-07-07 LAB — TSH: TSH: 1.17 u[IU]/mL (ref 0.450–4.500)

## 2024-07-07 LAB — COMPREHENSIVE METABOLIC PANEL WITH GFR
ALT: 24 IU/L (ref 0–44)
AST: 28 IU/L (ref 0–40)
Albumin: 5 g/dL (ref 4.1–5.1)
Alkaline Phosphatase: 90 IU/L (ref 44–121)
BUN/Creatinine Ratio: 11 (ref 9–20)
BUN: 14 mg/dL (ref 6–20)
Bilirubin Total: 0.5 mg/dL (ref 0.0–1.2)
CO2: 22 mmol/L (ref 20–29)
Calcium: 10.1 mg/dL (ref 8.7–10.2)
Chloride: 99 mmol/L (ref 96–106)
Creatinine, Ser: 1.3 mg/dL — ABNORMAL HIGH (ref 0.76–1.27)
Globulin, Total: 2.5 g/dL (ref 1.5–4.5)
Glucose: 80 mg/dL (ref 70–99)
Potassium: 4.9 mmol/L (ref 3.5–5.2)
Sodium: 138 mmol/L (ref 134–144)
Total Protein: 7.5 g/dL (ref 6.0–8.5)
eGFR: 73 mL/min/1.73 (ref >59)

## 2024-07-07 LAB — CBC WITH DIFFERENTIAL/PLATELET
Basophils Absolute: 0.1 x10E3/uL (ref 0.0–0.2)
Basos: 1 %
EOS (ABSOLUTE): 0 x10E3/uL (ref 0.0–0.4)
Eos: 0 %
Hematocrit: 46.3 % (ref 37.5–51.0)
Hemoglobin: 15.8 g/dL (ref 13.0–17.7)
Immature Grans (Abs): 0 x10E3/uL (ref 0.0–0.1)
Immature Granulocytes: 0 %
Lymphocytes Absolute: 1.8 x10E3/uL (ref 0.7–3.1)
Lymphs: 19 %
MCH: 30.9 pg (ref 26.6–33.0)
MCHC: 34.1 g/dL (ref 31.5–35.7)
MCV: 90 fL (ref 79–97)
Monocytes Absolute: 0.6 x10E3/uL (ref 0.1–0.9)
Monocytes: 7 %
Neutrophils Absolute: 6.8 x10E3/uL (ref 1.4–7.0)
Neutrophils: 73 %
Platelets: 278 x10E3/uL (ref 150–450)
RBC: 5.12 x10E6/uL (ref 4.14–5.80)
RDW: 12.2 % (ref 11.6–15.4)
WBC: 9.3 x10E3/uL (ref 3.4–10.8)

## 2024-07-07 LAB — HEPATITIS C ANTIBODY: Hep C Virus Ab: NONREACTIVE

## 2024-07-07 LAB — LIPID PANEL
Chol/HDL Ratio: 3 ratio (ref 0.0–5.0)
Cholesterol, Total: 179 mg/dL (ref 100–199)
HDL: 59 mg/dL (ref >39)
LDL Chol Calc (NIH): 106 mg/dL — ABNORMAL HIGH (ref 0–99)
Triglycerides: 74 mg/dL (ref 0–149)
VLDL Cholesterol Cal: 14 mg/dL (ref 5–40)

## 2024-07-07 LAB — HIV ANTIBODY (ROUTINE TESTING W REFLEX): HIV Screen 4th Generation wRfx: NONREACTIVE

## 2024-07-09 ENCOUNTER — Ambulatory Visit: Payer: Self-pay | Admitting: Physician Assistant

## 2024-08-08 ENCOUNTER — Other Ambulatory Visit: Payer: Self-pay | Admitting: Physician Assistant

## 2024-08-08 DIAGNOSIS — R7989 Other specified abnormal findings of blood chemistry: Secondary | ICD-10-CM

## 2024-08-13 ENCOUNTER — Telehealth: Payer: Self-pay | Admitting: Physician Assistant

## 2024-08-13 NOTE — Telephone Encounter (Signed)
 LVM reminding patient to check MyChart and repeat labs.

## 2024-08-17 ENCOUNTER — Ambulatory Visit: Payer: Self-pay | Admitting: Physician Assistant

## 2024-08-17 LAB — BASIC METABOLIC PANEL WITH GFR
BUN/Creatinine Ratio: 9 (ref 9–20)
BUN: 10 mg/dL (ref 6–20)
CO2: 24 mmol/L (ref 20–29)
Calcium: 10.2 mg/dL (ref 8.7–10.2)
Chloride: 102 mmol/L (ref 96–106)
Creatinine, Ser: 1.09 mg/dL (ref 0.76–1.27)
Glucose: 84 mg/dL (ref 70–99)
Potassium: 4.7 mmol/L (ref 3.5–5.2)
Sodium: 141 mmol/L (ref 134–144)
eGFR: 90 mL/min/1.73 (ref >59)
